# Patient Record
Sex: Female | Born: 1955 | Race: Black or African American | Hispanic: No | Smoking: Never smoker
Health system: Southern US, Community
[De-identification: ages and names within clinical notes are randomized; demographics above are authoritative.]

## PROBLEM LIST (undated history)

## (undated) DIAGNOSIS — K219 Gastro-esophageal reflux disease without esophagitis: Secondary | ICD-10-CM

## (undated) DIAGNOSIS — T7840XA Allergy, unspecified, initial encounter: Secondary | ICD-10-CM

## (undated) DIAGNOSIS — E119 Type 2 diabetes mellitus without complications: Secondary | ICD-10-CM

## (undated) DIAGNOSIS — M810 Age-related osteoporosis without current pathological fracture: Secondary | ICD-10-CM

## (undated) DIAGNOSIS — G43909 Migraine, unspecified, not intractable, without status migrainosus: Secondary | ICD-10-CM

## (undated) DIAGNOSIS — J45909 Unspecified asthma, uncomplicated: Secondary | ICD-10-CM

## (undated) DIAGNOSIS — M1990 Unspecified osteoarthritis, unspecified site: Secondary | ICD-10-CM

## (undated) DIAGNOSIS — F419 Anxiety disorder, unspecified: Secondary | ICD-10-CM

## (undated) DIAGNOSIS — N83209 Unspecified ovarian cyst, unspecified side: Secondary | ICD-10-CM

## (undated) DIAGNOSIS — M75100 Unspecified rotator cuff tear or rupture of unspecified shoulder, not specified as traumatic: Secondary | ICD-10-CM

## (undated) HISTORY — PX: BREAST LUMPECTOMY: SHX2

## (undated) HISTORY — DX: Unspecified ovarian cyst, unspecified side: N83.209

## (undated) HISTORY — PX: BREAST EXCISIONAL BIOPSY: SUR124

## (undated) HISTORY — DX: Allergy, unspecified, initial encounter: T78.40XA

## (undated) HISTORY — DX: Gastro-esophageal reflux disease without esophagitis: K21.9

## (undated) HISTORY — DX: Age-related osteoporosis without current pathological fracture: M81.0

## (undated) HISTORY — PX: COLONOSCOPY: SHX174

## (undated) HISTORY — DX: Type 2 diabetes mellitus without complications: E11.9

## (undated) HISTORY — DX: Unspecified asthma, uncomplicated: J45.909

## (undated) HISTORY — DX: Anxiety disorder, unspecified: F41.9

---

## 2018-04-16 ENCOUNTER — Encounter (HOSPITAL_COMMUNITY): Payer: Self-pay | Admitting: Emergency Medicine

## 2018-04-16 ENCOUNTER — Emergency Department (HOSPITAL_COMMUNITY)
Admission: EM | Admit: 2018-04-16 | Discharge: 2018-04-17 | Disposition: A | Discharge status: Home / Self Care | Payer: BLUE CROSS/BLUE SHIELD | Attending: Emergency Medicine | Admitting: Emergency Medicine

## 2018-04-16 ENCOUNTER — Other Ambulatory Visit: Payer: Self-pay

## 2018-04-16 ENCOUNTER — Emergency Department (HOSPITAL_COMMUNITY): Payer: BLUE CROSS/BLUE SHIELD

## 2018-04-16 DIAGNOSIS — R0789 Other chest pain: Secondary | ICD-10-CM | POA: Diagnosis present

## 2018-04-16 DIAGNOSIS — Z79899 Other long term (current) drug therapy: Secondary | ICD-10-CM | POA: Diagnosis not present

## 2018-04-16 HISTORY — DX: Unspecified osteoarthritis, unspecified site: M19.90

## 2018-04-16 HISTORY — DX: Migraine, unspecified, not intractable, without status migrainosus: G43.909

## 2018-04-16 LAB — CBC
HCT: 39.7 % (ref 36.0–46.0)
Hemoglobin: 12.1 g/dL (ref 12.0–15.0)
MCH: 24.6 pg — ABNORMAL LOW (ref 26.0–34.0)
MCHC: 30.5 g/dL (ref 30.0–36.0)
MCV: 80.7 fL (ref 80.0–100.0)
Platelets: 300 10*3/uL (ref 150–400)
RBC: 4.92 MIL/uL (ref 3.87–5.11)
RDW: 15.4 % (ref 11.5–15.5)
WBC: 6.5 10*3/uL (ref 4.0–10.5)
nRBC: 0 % (ref 0.0–0.2)

## 2018-04-16 LAB — BASIC METABOLIC PANEL
ANION GAP: 7 (ref 5–15)
BUN: 13 mg/dL (ref 8–23)
CO2: 23 mmol/L (ref 22–32)
Calcium: 9.2 mg/dL (ref 8.9–10.3)
Chloride: 108 mmol/L (ref 98–111)
Creatinine, Ser: 0.77 mg/dL (ref 0.44–1.00)
GFR calc non Af Amer: >60 mL/min (ref >60)
GLUCOSE: 126 mg/dL — AB (ref 70–99)
Potassium: 3.5 mmol/L (ref 3.5–5.1)
Sodium: 138 mmol/L (ref 135–145)

## 2018-04-16 LAB — I-STAT TROPONIN, ED: Troponin i, poc: 0 ng/mL (ref 0.00–0.08)

## 2018-04-16 MED ORDER — SODIUM CHLORIDE 0.9% FLUSH: 3.0000 mL | Freq: Once | INTRAVENOUS | Status: DC

## 2018-04-16 NOTE — ED Provider Notes (Signed)
[facility] EMERGENCY DEPARTMENT Provider Note   CSN: [id number] Arrival date & time: 04/16/18  2057    History   Chief Complaint Chief Complaint  Patient presents with   Chest Pain    HPI Amber Nash is a 63 y.o. female.      Chest Pain  Pain location:  Substernal area Pain quality: tightness   Pain radiates to:  Does not radiate Pain severity:  Moderate Onset quality:  Gradual Duration:  1 day Timing:  Constant Progression:  Worsening Chronicity:  New Context: breathing   Relieved by:  None tried Associated symptoms: shortness of breath   Associated symptoms: no abdominal pain, no back pain, no cough, no fever, no palpitations and no vomiting     Past Medical History:  Diagnosis Date   Arthritis    Migraine     There are no active problems to display for this patient.   Past Surgical History:  Procedure Laterality Date   BREAST LUMPECTOMY Left      OB History   No obstetric history on file.      Home Medications    Prior to Admission medications   Medication Sig Start Date End Date Taking? Authorizing Provider  cholecalciferol (VITAMIN D3) 25 MCG (1000 UT) tablet Take 1,000 Units by mouth daily.   Yes [provider]  rizatriptan (MAXALT) 10 MG tablet Take 10 mg by mouth daily as needed for migraine. May repeat in 2 hours if needed   Yes [provider]    Family History No family history on file.  Social History Social History   Tobacco Use   Smoking status: Never Smoker   Smokeless tobacco: Never Used  Substance Use Topics   Alcohol use: Not Currently   Drug use: Not Currently     Allergies   Patient has no known allergies.   Review of Systems Review of Systems  Constitutional: Negative for chills and fever.  HENT: Negative for ear pain and sore throat.   Eyes: Negative for pain and visual disturbance.  Respiratory: Positive for shortness of breath. Negative for cough.     Cardiovascular: Positive for chest pain. Negative for palpitations.  Gastrointestinal: Negative for abdominal pain and vomiting.  Genitourinary: Negative for dysuria and hematuria.  Musculoskeletal: Negative for arthralgias and back pain.  Skin: Negative for color change and rash.  Neurological: Negative for seizures and syncope.  All other systems reviewed and are negative.    Physical Exam Updated Vital Signs BP 105/71    Pulse 75    Temp (!) 97.5 F (36.4 C) (Oral)    Resp 13    Ht 5\' 4"  (1.626 m)    Wt 56.7 kg    SpO2 97%    BMI 21.46 kg/m   Physical Exam Vitals signs and nursing note reviewed.  Constitutional:      General: She is not in acute distress.    Appearance: She is well-developed.  HENT:     Head: Normocephalic and atraumatic.  Eyes:     Conjunctiva/sclera: Conjunctivae normal.  Neck:     Musculoskeletal: Neck supple.  Cardiovascular:     Rate and Rhythm: Normal rate and regular rhythm.     Heart sounds: No murmur.  Pulmonary:     Effort: Pulmonary effort is normal. No respiratory distress.     Breath sounds: Normal breath sounds.  Abdominal:     Palpations: Abdomen is soft.     Tenderness: There is no abdominal tenderness.  Musculoskeletal: Normal range of motion.        General: No swelling.     Right lower leg: No edema.     Left lower leg: No edema.  Skin:    General: Skin is warm and dry.  Neurological:     General: No focal deficit present.     Mental Status: She is alert and oriented to person, place, and time.     Cranial Nerves: No cranial nerve deficit.     Sensory: No sensory deficit.      ED Treatments / Results  Labs (all labs ordered are listed, but only abnormal results are displayed) Labs Reviewed  BASIC METABOLIC PANEL - Abnormal; Notable for the following components:      Result Value   Glucose, Bld 126 (*)    All other components within normal limits  CBC - Abnormal; Notable for the following components:   MCH 24.6 (*)     All other components within normal limits  D-DIMER, QUANTITATIVE (NOT AT [facility])  TROPONIN I  I-STAT TROPONIN, ED    EKG None  Radiology Dg Chest 2 View  Result Date: 04/16/2018 CLINICAL DATA:  Central chest tightness, shortness of breath EXAM: CHEST - 2 VIEW COMPARISON:  None. FINDINGS: Heart and mediastinal contours are within normal limits. No focal opacities or effusions. No acute bony abnormality. IMPRESSION: No active cardiopulmonary disease. Electronically Signed   By: Rolm Baptise M.D.   On: 04/16/2018 21:29    Procedures Procedures (including critical care time)  Medications Ordered in ED Medications  sodium chloride flush (NS) 0.9 % injection 3 mL (has no administration in time range)  albuterol (PROVENTIL HFA;VENTOLIN HFA) 108 (90 Base) MCG/ACT inhaler 2 puff (2 puffs Inhalation Given 04/17/18 0055)     Initial Impression / Assessment and Plan / ED Course  I have reviewed the triage vital signs and the nursing notes.  Pertinent labs & imaging results that were available during my care of the patient were reviewed by me and considered in my medical decision making (see chart for details).       Patient is a 63 year old female who presents the emergency department complaining of substernal chest pain that started earlier this morning.  Patient states that pain has been waxing and waning throughout the day however is been constant now for the past 4 hours.  She describes the pain as a tightness located in the center of her chest.  She states that she recently returned from a trip to [place] 2 days ago where she spent extended amount of time in a car.  She denies any unilateral lower extremity swelling.  On initial evaluation of the patient she was hemodynamically stable and nontoxic-appearing.  Patient was afebrile, not tachycardic, normotensive, satting appropriately on room air.  Physical exam as detailed above which is remarkable for comfortable appearing 63 year old  female.  Lungs are clear to auscultation and no murmurs rubs or gallops are appreciated on cardiovascular examination.  She has equal pulses in all 4 extremities with no unilateral lower extremity swelling present.  Prior to my evaluation of the patient she had EKG, chest x-ray, and basic labs obtained via the triage process.  These were reviewed by myself and used in medical decision-making.  EKG showing normal sinus rhythm with normal intervals.  Normal axis, good R wave progression, and no ST or T wave abnormalities concerning for acute ischemia.  Chest x-ray with no acute cardiac or pulmonary pathology.  CBC and metabolic  panel both unremarkable.  Initial troponin negative.  Patient low risk by HEART score of 3.  Unable to [facility] patient as she is 63 years old.  She is low risk by Wells criteria so d-dimer was obtained.  Doubt aortic dissection, pneumothorax, pneumomediastinum, or esophageal rupture as cause of the patient's current symptoms as this would be inconsistent with history, physical exam findings, and imaging obtained.  While awaiting for the patient's d-dimer and delta troponin she was given 2 puffs of albuterol.  She states she has a history of cough variant asthma that was diagnosed 1 year ago and she had similar tightness in her chest at that time.  She does not have impressive wheezing on examination that would make me think this is acute asthma exacerbation however 2 puffs of albuterol were given in attempt to improve patient's chest tightness.  While awaiting D-dimer result and Delta troponin care was transferred to [facility]. If both negative patient is safe for discharge home as symptoms are likely MSK related. Please see their note for ultimate ED disposition.   Final Clinical Impressions(s) / ED Diagnoses   Final diagnoses:  Chest wall pain    ED Discharge Orders    None       Tommie Raymond, MD 04/17/18 1478    Noemi Chapel, MD 04/17/18 2326

## 2018-04-16 NOTE — ED Triage Notes (Signed)
Pt reports chest tightness onset this AM with dizziness/lightheadedness, SOB. 5/10. Radiates to back.

## 2018-04-17 LAB — D-DIMER, QUANTITATIVE: D-Dimer, Quant: <0.27 ug/mL-FEU (ref 0.00–0.50)

## 2018-04-17 LAB — TROPONIN I: Troponin I: <0.03 ng/mL (ref <0.03)

## 2018-04-17 MED ORDER — ALBUTEROL SULFATE HFA 108 (90 BASE) MCG/ACT IN AERS
2.0000 | INHALATION_SPRAY | Freq: Once | RESPIRATORY_TRACT | Status: AC
  Administered 2018-04-17: 2 via RESPIRATORY_TRACT
  Filled 2018-04-17: qty 6.7

## 2018-04-17 NOTE — ED Provider Notes (Signed)
2:43 AM Patient care assumed from Dr. Damita Dunnings at change of shift pending delta troponin and d-dimer.  These have been reviewed and are both negative today.  The patient has had some improvement in her chest tightness with an albuterol inhaler.  I have advised that she continue use of this every 4-6 hours as needed and follow-up with her primary care doctor.  Symptoms also suspected to have a MSK component, appropriate for management with OTC tylenol or ibuprofen.  Return precautions discussed and provided. Patient discharged in stable condition with no unaddressed concerns.  Results for orders placed or performed during the hospital encounter of 16/10/96  Basic metabolic panel  Result Value Ref Range   Sodium 138 135 - 145 mmol/L   Potassium 3.5 3.5 - 5.1 mmol/L   Chloride 108 98 - 111 mmol/L   CO2 23 22 - 32 mmol/L   Glucose, Bld 126 (H) 70 - 99 mg/dL   BUN 13 8 - 23 mg/dL   Creatinine, Ser 0.77 0.44 - 1.00 mg/dL   Calcium 9.2 8.9 - 10.3 mg/dL   GFR calc non Af Amer >60 >60 mL/min   GFR calc Af Amer >60 >60 mL/min   Anion gap 7 5 - 15  CBC  Result Value Ref Range   WBC 6.5 4.0 - 10.5 K/uL   RBC 4.92 3.87 - 5.11 MIL/uL   Hemoglobin 12.1 12.0 - 15.0 g/dL   HCT 39.7 36.0 - 46.0 %   MCV 80.7 80.0 - 100.0 fL   MCH 24.6 (L) 26.0 - 34.0 pg   MCHC 30.5 30.0 - 36.0 g/dL   RDW 15.4 11.5 - 15.5 %   Platelets 300 150 - 400 K/uL   nRBC 0.0 0.0 - 0.2 %  D-dimer, quantitative (not at [facility])  Result Value Ref Range   D-Dimer, Quant <0.27 0.00 - 0.50 ug/mL-FEU  Troponin I - ONCE - STAT  Result Value Ref Range   Troponin I <0.03 <0.03 ng/mL  I-stat troponin, ED  Result Value Ref Range   Troponin i, poc 0.00 0.00 - 0.08 ng/mL   Comment 3           Dg Chest 2 View  Result Date: 04/16/2018 CLINICAL DATA:  Central chest tightness, shortness of breath EXAM: CHEST - 2 VIEW COMPARISON:  None. FINDINGS: Heart and mediastinal contours are within normal limits. No focal opacities or effusions. No acute  bony abnormality. IMPRESSION: No active cardiopulmonary disease. Electronically Signed   By: Rolm Baptise M.D.   On: 04/16/2018 21:29      Antonietta Breach, PA-C 04/17/18 [place], April, MD 04/17/18 [phone]

## 2018-04-17 NOTE — Discharge Instructions (Addendum)
You may continue use of an albuterol inhaler.  Use 2 puffs every 4-6 hours as needed for cough, wheezing, shortness of breath.  We recommend follow-up with a primary care doctor regarding your symptoms today.

## 2018-11-20 ENCOUNTER — Other Ambulatory Visit: Payer: Self-pay | Admitting: Internal Medicine

## 2018-11-20 DIAGNOSIS — E2839 Other primary ovarian failure: Secondary | ICD-10-CM

## 2018-11-20 DIAGNOSIS — Z1231 Encounter for screening mammogram for malignant neoplasm of breast: Secondary | ICD-10-CM

## 2018-11-26 ENCOUNTER — Ambulatory Visit
Admission: RE | Admit: 2018-11-26 | Discharge: 2018-11-26 | Disposition: A | Discharge status: Home / Self Care | Payer: PRIVATE HEALTH INSURANCE | Source: Ambulatory Visit | Attending: Internal Medicine | Admitting: Internal Medicine

## 2018-11-26 ENCOUNTER — Other Ambulatory Visit: Payer: Self-pay | Admitting: Internal Medicine

## 2018-11-26 DIAGNOSIS — R140 Abdominal distension (gaseous): Secondary | ICD-10-CM

## 2018-11-26 DIAGNOSIS — Z8041 Family history of malignant neoplasm of ovary: Secondary | ICD-10-CM

## 2019-01-11 ENCOUNTER — Other Ambulatory Visit: Payer: Self-pay

## 2019-01-11 ENCOUNTER — Ambulatory Visit
Admission: RE | Admit: 2019-01-11 | Discharge: 2019-01-11 | Disposition: A | Discharge status: Home / Self Care | Payer: Medicare Other | Source: Ambulatory Visit | Attending: Internal Medicine | Admitting: Internal Medicine

## 2019-01-11 DIAGNOSIS — Z1231 Encounter for screening mammogram for malignant neoplasm of breast: Secondary | ICD-10-CM

## 2019-02-11 ENCOUNTER — Encounter: Payer: Self-pay | Admitting: Gastroenterology

## 2019-02-12 ENCOUNTER — Other Ambulatory Visit: Payer: Self-pay | Admitting: Internal Medicine

## 2019-02-13 ENCOUNTER — Other Ambulatory Visit: Payer: Self-pay | Admitting: Internal Medicine

## 2019-02-19 ENCOUNTER — Other Ambulatory Visit: Payer: Self-pay

## 2019-02-19 ENCOUNTER — Ambulatory Visit
Admission: RE | Admit: 2019-02-19 | Discharge: 2019-02-19 | Disposition: A | Discharge status: Home / Self Care | Payer: Medicare Other | Source: Ambulatory Visit | Attending: Internal Medicine | Admitting: Internal Medicine

## 2019-02-19 DIAGNOSIS — E2839 Other primary ovarian failure: Secondary | ICD-10-CM

## 2019-02-23 ENCOUNTER — Other Ambulatory Visit: Payer: Self-pay | Admitting: Internal Medicine

## 2019-02-23 DIAGNOSIS — N83201 Unspecified ovarian cyst, right side: Secondary | ICD-10-CM

## 2019-02-25 ENCOUNTER — Ambulatory Visit (AMBULATORY_SURGERY_CENTER): Payer: Self-pay | Admitting: *Deleted

## 2019-02-25 ENCOUNTER — Other Ambulatory Visit: Payer: Self-pay

## 2019-02-25 VITALS — Temp 96.0°F | Ht 64.0 in | Wt 132.0 lb

## 2019-02-25 DIAGNOSIS — Z01818 Encounter for other preprocedural examination: Secondary | ICD-10-CM

## 2019-02-25 DIAGNOSIS — Z1211 Encounter for screening for malignant neoplasm of colon: Secondary | ICD-10-CM

## 2019-02-25 NOTE — Progress Notes (Signed)
No egg or soy allergy known to patient  No issues with past sedation with any surgeries  or procedures, no intubation problems  No diet pills per patient No home 02 use per patient  No blood thinners per patient  Pt denies issues with constipation - uses dulcolax as needed- giver her loose stools -- feels bloated  No A fib or A flutter  EMMI video sent to pt's e mail   Due to the COVID-19 pandemic we are asking patients to follow these guidelines. Please only bring one care partner. Please be aware that your care partner may wait in the car in the parking lot or if they feel like they will be too hot to wait in the car, they may wait in the lobby on the 4th floor. All care partners are required to wear a mask the entire time (we do not have any that we can provide them), they need to practice social distancing, and we will do a Covid check for all patient's and care partners when you arrive. Also we will check their temperature and your temperature. If the care partner waits in their car they need to stay in the parking lot the entire time and we will call them on their cell phone when the patient is ready for discharge so they can bring the car to the front of the building. Also all patient's will need to wear a mask into building.

## 2019-03-07 ENCOUNTER — Other Ambulatory Visit: Payer: Self-pay | Admitting: Gastroenterology

## 2019-03-07 ENCOUNTER — Ambulatory Visit (INDEPENDENT_AMBULATORY_CARE_PROVIDER_SITE_OTHER): Payer: Medicare Other

## 2019-03-07 DIAGNOSIS — Z1159 Encounter for screening for other viral diseases: Secondary | ICD-10-CM

## 2019-03-08 LAB — SARS CORONAVIRUS 2 (TAT 6-24 HRS): SARS Coronavirus 2: NEGATIVE

## 2019-03-12 ENCOUNTER — Ambulatory Visit (AMBULATORY_SURGERY_CENTER): Payer: Medicare Other | Admitting: Gastroenterology

## 2019-03-12 ENCOUNTER — Encounter: Payer: Self-pay | Admitting: Gastroenterology

## 2019-03-12 ENCOUNTER — Other Ambulatory Visit: Payer: Self-pay

## 2019-03-12 VITALS — BP 87/47 | HR 87 | Temp 96.6°F | Resp 14 | Ht 64.0 in | Wt 132.0 lb

## 2019-03-12 DIAGNOSIS — K621 Rectal polyp: Secondary | ICD-10-CM

## 2019-03-12 DIAGNOSIS — D128 Benign neoplasm of rectum: Secondary | ICD-10-CM

## 2019-03-12 DIAGNOSIS — Z1211 Encounter for screening for malignant neoplasm of colon: Secondary | ICD-10-CM | POA: Diagnosis present

## 2019-03-12 MED ORDER — SODIUM CHLORIDE 0.9 % IV SOLN: 500.0000 mL | Freq: Once | INTRAVENOUS | Status: DC

## 2019-03-12 NOTE — Progress Notes (Signed)
Called to room to assist during endoscopic procedure.  Patient ID and intended procedure confirmed with present staff. Received instructions for my participation in the procedure from the performing physician.  

## 2019-03-12 NOTE — Progress Notes (Signed)
Patient received 1st COVID vaccine on Feb 4.  2nd vaginal imaging in early Feb showed nothing and patient will not have the scheduled surgery.  JB - temp DT - vitals

## 2019-03-12 NOTE — Op Note (Signed)
[facility] Patient Name: Amber Nash Procedure Date: 03/12/2019 10:48 AM MRN: [id number] Endoscopist: Justice Britain , MD Age: 64 Referring MD:  Date of Birth: [birth year] Gender: Female Account #: [id number] Procedure:                Colonoscopy Indications:              Screening for colorectal malignant neoplasm Medicines:                Monitored Anesthesia Care Procedure:                Pre-Anesthesia Assessment:                           - Prior to the procedure, a History and Physical                            was performed, and patient medications and                            allergies were reviewed. The patient's tolerance of                            previous anesthesia was also reviewed. The risks                            and benefits of the procedure and the sedation                            options and risks were discussed with the patient.                            All questions were answered, and informed consent                            was obtained. Prior Anticoagulants: The patient has                            taken no previous anticoagulant or antiplatelet                            agents except for aspirin. ASA Grade Assessment: II                            - A patient with mild systemic disease. After                            reviewing the risks and benefits, the patient was                            deemed in satisfactory condition to undergo the                            procedure.  After obtaining informed consent, the colonoscope                            was passed under direct vision. Throughout the                            procedure, the patient's blood pressure, pulse, and                            oxygen saturations were monitored continuously. The                            Colonoscope was introduced through the anus and                            advanced to the 5 cm into the ileum. The                        colonoscopy was performed without difficulty. The                            patient tolerated the procedure. The quality of the                            bowel preparation was good. The terminal ileum,                            ileocecal valve, appendiceal orifice, and rectum                            were photographed. Scope In: 11:01:52 AM Scope Out: 11:17:18 AM Scope Withdrawal Time: 0 hours 10 minutes 32 seconds  Total Procedure Duration: 0 hours 15 minutes 26 seconds  Findings:                 The digital rectal exam was abnormal. Pertinent                            negatives include no palpable rectal lesions.                           The terminal ileum and ileocecal valve appeared                            normal.                           A few small-mouthed diverticula were found in the                            recto-sigmoid colon.                           A 2 mm polyp was found in the rectum. The polyp was  sessile. The polyp was removed with a cold snare.                            Resection and retrieval were complete.                           Normal mucosa was found in the entire colon                            otherwise.                           Non-bleeding non-thrombosed internal hemorrhoids                            were found during retroflexion, during perianal                            exam and during digital exam. The hemorrhoids were                            Grade I (internal hemorrhoids that do not prolapse). Complications:            No immediate complications. Estimated Blood Loss:     Estimated blood loss was minimal. Impression:               - Abnormal digital rectal exam.                           - The examined portion of the ileum was normal.                           - Diverticulosis in the recto-sigmoid colon.                           - One 2 mm polyp in the rectum, removed with a cold                             snare. Resected and retrieved.                           - Normal mucosa in the entire examined colon                            otherwise.                           - Non-bleeding non-thrombosed internal hemorrhoids. Recommendation:           - The patient will be observed post-procedure,                            until all discharge criteria are met.                           - Discharge patient to home.                           -  Patient has a contact number available for                            emergencies. The signs and symptoms of potential                            delayed complications were discussed with the                            patient. Return to normal activities tomorrow.                            Written discharge instructions were provided to the                            patient.                           - High fiber diet.                           - Use FiberCon 1 tablet PO daily.                           - Continue present medications.                           - Await pathology results.                           - Repeat colonoscopy in 7/10 years for surveillance                            based on pathology results and findings of                            adenomatous tissue.                           - The findings and recommendations were discussed                            with the patient. Justice Britain, MD 03/12/2019 11:21:42 AM

## 2019-03-12 NOTE — Patient Instructions (Signed)
HANDOUTS PROVIDED ON: HIGH FIBER DIET, POLYPS, DIVERTICULOSIS, & HEMORRHOIDS  The polyp removed today have been sent for pathology.  The results can take 1-3 weeks to receive.  When your next colonoscopy should occur will be based on the pathology results.    You may resume your previous diet and medication schedule.  Begin using OTC Fibercon 1 tablet daily.  Thank you for allowing [place] to care for you today!!!  YOU HAD AN ENDOSCOPIC PROCEDURE TODAY AT [facility]:   Refer to the procedure report that was given to you for any specific questions about what was found during the examination.  If the procedure report does not answer your questions, please call your gastroenterologist to clarify.  If you requested that your care partner not be given the details of your procedure findings, then the procedure report has been included in a sealed envelope for you to review at your convenience later.  YOU SHOULD EXPECT: Some feelings of bloating in the abdomen. Passage of more gas than usual.  Walking can help get rid of the air that was put into your GI tract during the procedure and reduce the bloating. If you had a lower endoscopy (such as a colonoscopy or flexible sigmoidoscopy) you may notice spotting of blood in your stool or on the toilet paper. If you underwent a bowel prep for your procedure, you may not have a normal bowel movement for a few days.  Please Note:  You might notice some irritation and congestion in your nose or some drainage.  This is from the oxygen used during your procedure.  There is no need for concern and it should clear up in a day or so.  SYMPTOMS TO REPORT IMMEDIATELY:   Following lower endoscopy (colonoscopy or flexible sigmoidoscopy):  Excessive amounts of blood in the stool  Significant tenderness or worsening of abdominal pains  Swelling of the abdomen that is new, acute  Fever of 100F or higher  For urgent or emergent issues, a gastroenterologist  can be reached at any hour by calling [phone].   DIET:  We do recommend a small meal at first, but then you may proceed to your regular diet.  Drink plenty of fluids but you should avoid alcoholic beverages for 24 hours.  ACTIVITY:  You should plan to take it easy for the rest of today and you should NOT DRIVE or use heavy machinery until tomorrow (because of the sedation medicines used during the test).    FOLLOW UP: Our staff will call the number listed on your records 48-72 hours following your procedure to check on you and address any questions or concerns that you may have regarding the information given to you following your procedure. If we do not reach you, we will leave a message.  We will attempt to reach you two times.  During this call, we will ask if you have developed any symptoms of COVID 19. If you develop any symptoms (ie: fever, flu-like symptoms, shortness of breath, cough etc.) before then, please call [phone].  If you test positive for Covid 19 in the 2 weeks post procedure, please call and report this information to [place].    If any biopsies were taken you will be contacted by phone or by letter within the next 1-3 weeks.  Please call us at [phone] if you have not heard about the biopsies in 3 weeks.    SIGNATURES/CONFIDENTIALITY: You and/or your care partner have signed paperwork which  will be entered into your electronic medical record.  These signatures attest to the fact that that the information above on your After Visit Summary has been reviewed and is understood.  Full responsibility of the confidentiality of this discharge information lies with you and/or your care-partner.

## 2019-03-12 NOTE — Progress Notes (Signed)
Pt tolerated well. VSS. Awake and to recovery. 

## 2019-03-13 ENCOUNTER — Telehealth: Payer: Self-pay

## 2019-03-13 NOTE — Telephone Encounter (Signed)
Amber Nash can you help with this?

## 2019-03-13 NOTE — Telephone Encounter (Signed)
Returned patient's call about her glasses missing after her procedure yesterday.  Glasses were present before going over her discharge instructions and placed with her other belongings at bedside.  She has been unable to find them.  I assured her if they turn up I will call her to let her know.

## 2019-03-14 ENCOUNTER — Telehealth: Payer: Self-pay

## 2019-03-14 NOTE — Telephone Encounter (Signed)
  Follow up Call-  Call back number 03/12/2019  Post procedure Call Back phone  # [phone]  Permission to leave phone message Yes     Patient questions:  Do you have a fever, pain , or abdominal swelling? No. Pain Score  0 *  Have you tolerated food without any problems? Yes.    Have you been able to return to your normal activities? Yes.    Do you have any questions about your discharge instructions: Diet   No. Medications  No. Follow up visit  No.  Do you have questions or concerns about your Care? No.  Actions: * If pain score is 4 or above: No action needed, pain <4. 1. Have you developed a fever since your procedure? no  2.   Have you had an respiratory symptoms (SOB or cough) since your procedure? no  3.   Have you tested positive for COVID 19 since your procedure no  4.   Have you had any family members/close contacts diagnosed with the COVID 19 since your procedure?  no   If yes to any of these questions please route to Joylene John, RN and Alphonsa Gin, Therapist, sports.

## 2019-03-16 ENCOUNTER — Encounter: Payer: Self-pay | Admitting: Gastroenterology

## 2019-05-09 ENCOUNTER — Other Ambulatory Visit: Payer: Self-pay | Admitting: General Surgery

## 2019-05-09 DIAGNOSIS — N6452 Nipple discharge: Secondary | ICD-10-CM

## 2019-05-27 ENCOUNTER — Other Ambulatory Visit: Payer: Self-pay

## 2019-05-27 ENCOUNTER — Ambulatory Visit
Admission: RE | Admit: 2019-05-27 | Discharge: 2019-05-27 | Disposition: A | Discharge status: Home / Self Care | Payer: Medicare Other | Source: Ambulatory Visit | Attending: General Surgery | Admitting: General Surgery

## 2019-05-27 DIAGNOSIS — N6452 Nipple discharge: Secondary | ICD-10-CM

## 2019-05-27 MED ORDER — GADOBUTROL 1 MMOL/ML IV SOLN
6.0000 mL | Freq: Once | INTRAVENOUS | Status: AC | PRN
  Administered 2019-05-27: 6 mL via INTRAVENOUS

## 2019-06-10 ENCOUNTER — Other Ambulatory Visit: Payer: Self-pay | Admitting: General Surgery

## 2019-06-10 DIAGNOSIS — N6320 Unspecified lump in the left breast, unspecified quadrant: Secondary | ICD-10-CM

## 2019-06-10 DIAGNOSIS — K7689 Other specified diseases of liver: Secondary | ICD-10-CM

## 2019-06-10 DIAGNOSIS — N6310 Unspecified lump in the right breast, unspecified quadrant: Secondary | ICD-10-CM

## 2019-06-11 ENCOUNTER — Other Ambulatory Visit: Payer: Self-pay | Admitting: General Surgery

## 2019-06-11 ENCOUNTER — Ambulatory Visit
Admission: RE | Admit: 2019-06-11 | Discharge: 2019-06-11 | Disposition: A | Discharge status: Home / Self Care | Payer: Medicare Other | Source: Ambulatory Visit | Attending: General Surgery | Admitting: General Surgery

## 2019-06-11 ENCOUNTER — Other Ambulatory Visit: Payer: Self-pay

## 2019-06-11 DIAGNOSIS — N6310 Unspecified lump in the right breast, unspecified quadrant: Secondary | ICD-10-CM

## 2019-06-11 DIAGNOSIS — N6320 Unspecified lump in the left breast, unspecified quadrant: Secondary | ICD-10-CM

## 2019-06-12 ENCOUNTER — Other Ambulatory Visit: Payer: Self-pay | Admitting: General Surgery

## 2019-06-12 DIAGNOSIS — R9389 Abnormal findings on diagnostic imaging of other specified body structures: Secondary | ICD-10-CM

## 2019-06-13 ENCOUNTER — Ambulatory Visit
Admission: RE | Admit: 2019-06-13 | Discharge: 2019-06-13 | Disposition: A | Discharge status: Home / Self Care | Payer: Medicare Other | Source: Ambulatory Visit | Attending: General Surgery | Admitting: General Surgery

## 2019-06-13 DIAGNOSIS — K7689 Other specified diseases of liver: Secondary | ICD-10-CM

## 2019-06-25 ENCOUNTER — Ambulatory Visit
Admission: RE | Admit: 2019-06-25 | Discharge: 2019-06-25 | Disposition: A | Discharge status: Home / Self Care | Payer: Medicare Other | Source: Ambulatory Visit | Attending: General Surgery | Admitting: General Surgery

## 2019-06-25 ENCOUNTER — Other Ambulatory Visit: Payer: Self-pay

## 2019-06-25 DIAGNOSIS — R9389 Abnormal findings on diagnostic imaging of other specified body structures: Secondary | ICD-10-CM

## 2019-06-25 MED ORDER — GADOBUTROL 1 MMOL/ML IV SOLN
5.0000 mL | Freq: Once | INTRAVENOUS | Status: AC | PRN
  Administered 2019-06-25: 5 mL via INTRAVENOUS

## 2019-07-08 ENCOUNTER — Other Ambulatory Visit: Payer: Self-pay | Admitting: General Practice

## 2019-07-08 DIAGNOSIS — R140 Abdominal distension (gaseous): Secondary | ICD-10-CM

## 2019-07-30 ENCOUNTER — Other Ambulatory Visit: Payer: Self-pay

## 2019-07-30 ENCOUNTER — Ambulatory Visit
Admission: RE | Admit: 2019-07-30 | Discharge: 2019-07-30 | Disposition: A | Discharge status: Home / Self Care | Payer: Medicare Other | Source: Ambulatory Visit | Attending: General Practice | Admitting: General Practice

## 2019-07-30 DIAGNOSIS — R140 Abdominal distension (gaseous): Secondary | ICD-10-CM

## 2019-07-30 MED ORDER — IOPAMIDOL (ISOVUE-300) INJECTION 61%
100.0000 mL | Freq: Once | INTRAVENOUS | Status: AC | PRN
  Administered 2019-07-30: 100 mL via INTRAVENOUS

## 2019-08-19 ENCOUNTER — Other Ambulatory Visit: Payer: Medicare Other

## 2019-12-24 DIAGNOSIS — Z1231 Encounter for screening mammogram for malignant neoplasm of breast: Secondary | ICD-10-CM

## 2020-01-08 ENCOUNTER — Other Ambulatory Visit: Payer: Self-pay | Admitting: Internal Medicine

## 2020-01-08 DIAGNOSIS — Z1231 Encounter for screening mammogram for malignant neoplasm of breast: Secondary | ICD-10-CM

## 2020-01-16 ENCOUNTER — Other Ambulatory Visit: Payer: Self-pay

## 2020-01-16 ENCOUNTER — Other Ambulatory Visit: Payer: Self-pay | Admitting: Rheumatology

## 2020-01-16 ENCOUNTER — Ambulatory Visit
Admission: RE | Admit: 2020-01-16 | Discharge: 2020-01-16 | Disposition: A | Discharge status: Home / Self Care | Payer: Medicare Other | Source: Ambulatory Visit | Attending: Internal Medicine | Admitting: Internal Medicine

## 2020-01-16 ENCOUNTER — Other Ambulatory Visit: Payer: Self-pay | Admitting: General Practice

## 2020-01-16 DIAGNOSIS — D241 Benign neoplasm of right breast: Secondary | ICD-10-CM

## 2020-01-16 DIAGNOSIS — Z1231 Encounter for screening mammogram for malignant neoplasm of breast: Secondary | ICD-10-CM

## 2020-01-16 DIAGNOSIS — D242 Benign neoplasm of left breast: Secondary | ICD-10-CM

## 2020-01-20 ENCOUNTER — Other Ambulatory Visit: Payer: Self-pay | Admitting: General Practice

## 2020-02-11 ENCOUNTER — Other Ambulatory Visit: Payer: Medicare Other

## 2020-06-05 ENCOUNTER — Other Ambulatory Visit: Payer: Self-pay

## 2020-06-05 ENCOUNTER — Emergency Department (HOSPITAL_COMMUNITY)
Admission: EM | Admit: 2020-06-05 | Discharge: 2020-06-05 | Disposition: A | Discharge status: Home / Self Care | Payer: Medicare Other | Attending: Emergency Medicine | Admitting: Emergency Medicine

## 2020-06-05 ENCOUNTER — Encounter (HOSPITAL_COMMUNITY): Payer: Self-pay | Admitting: Emergency Medicine

## 2020-06-05 DIAGNOSIS — U071 COVID-19: Secondary | ICD-10-CM | POA: Diagnosis not present

## 2020-06-05 DIAGNOSIS — R21 Rash and other nonspecific skin eruption: Secondary | ICD-10-CM | POA: Diagnosis present

## 2020-06-05 DIAGNOSIS — J45909 Unspecified asthma, uncomplicated: Secondary | ICD-10-CM | POA: Insufficient documentation

## 2020-06-05 LAB — I-STAT CHEM 8, ED
BUN: 12 mg/dL (ref 8–23)
Calcium, Ion: 1.24 mmol/L (ref 1.15–1.40)
Chloride: 102 mmol/L (ref 98–111)
Creatinine, Ser: 0.6 mg/dL (ref 0.44–1.00)
Glucose, Bld: 88 mg/dL (ref 70–99)
HCT: 46 % (ref 36.0–46.0)
Hemoglobin: 15.6 g/dL — ABNORMAL HIGH (ref 12.0–15.0)
Potassium: 3.7 mmol/L (ref 3.5–5.1)
Sodium: 140 mmol/L (ref 135–145)
TCO2: 27 mmol/L (ref 22–32)

## 2020-06-05 MED ORDER — NIRMATRELVIR/RITONAVIR (PAXLOVID)TABLET: 2.0000 | ORAL_TABLET | Freq: Two times a day (BID) | ORAL | 0 refills | Status: AC

## 2020-06-05 NOTE — Discharge Instructions (Addendum)
Prescription for Paxlovid given today. This is not stocked at all pharmacies. I recommend calling before going to take your prescription in.

## 2020-06-05 NOTE — ED Provider Notes (Signed)
[facility] DEPT Provider Note   CSN: [id number] Arrival date & time: 06/05/20  1040     History Chief Complaint  Patient presents with  . Eye Pain  . Rash  . Covid Positive    Amber Nash is a 65 y.o. female.  65 year old female with history of asthma presents with positive home COVID test with concern for rash and eye tingling.  Patient states that she noticed that she was coughing more with mucus production starting on May 2 but did not think anything of this.  Patient noticed a rash to her arms and legs described as red dots on May 4 and took a home COVID test which was positive.  Patient states that she noticed her eyes were red and tingling today with a rash to her back which prompted her to call her nurse who advised her to come to the ER for evaluation.  Patient applied over-the-counter allergy eyedrops to her eyes with resolution of redness, reports persistent tingling.  Denies shortness of breath, vomiting, diarrhea.  Patient is fully vaccinated including 2 boosters for COVID.        Past Medical History:  Diagnosis Date  . Allergy   . Anxiety   . Arthritis   . Asthma   . GERD (gastroesophageal reflux disease)   . Migraine   . Osteoporosis   . Ovarian cyst     There are no problems to display for this patient.   Past Surgical History:  Procedure Laterality Date  . BREAST EXCISIONAL BIOPSY Left   . BREAST LUMPECTOMY Left    7 yrs ago - benign   . COLONOSCOPY     in [place]-  ~ 10 yrs ago- she said polyps- but no report      OB History   No obstetric history on file.     Family History  Problem Relation Age of Onset  . Breast cancer Sister 50  . Colon cancer Neg Hx   . Colon polyps Neg Hx   . Esophageal cancer Neg Hx   . Prostate cancer Neg Hx   . Rectal cancer Neg Hx     Social History   Tobacco Use  . Smoking status: Never Smoker  . Smokeless tobacco: Never Used  Vaping Use  . Vaping Use: Never used   Substance Use Topics  . Alcohol use: Not Currently    Comment: occasionally   . Drug use: Not Currently    Home Medications Prior to Admission medications   Medication Sig Start Date End Date Taking? Authorizing Provider  nirmatrelvir/ritonavir EUA (PAXLOVID) TABS Take 2 tablets by mouth 2 (two) times daily for 5 days. Patient GFR is >60. Take nirmatrelvir (150 mg) two tablet(s) twice daily for 5 days and ritonavir (100 mg) one tablet twice daily for 5 days. 06/05/20 06/10/20 Yes Tacy Learn, PA-C  albuterol (VENTOLIN HFA) 108 (90 Base) MCG/ACT inhaler Inhale into the lungs every 6 (six) hours as needed for wheezing or shortness of breath.    [provider]  bisacodyl (DULCOLAX) 5 MG EC tablet Take 5 mg by mouth daily as needed for moderate constipation.    [provider]  cholecalciferol (VITAMIN D3) 25 MCG (1000 UT) tablet Take 1,000 Units by mouth daily.    [provider]  fexofenadine-pseudoephedrine (ALLEGRA-D) 60-120 MG 12 hr tablet Take 1 tablet by mouth 2 (two) times daily.    [provider]  rizatriptan (MAXALT) 10 MG tablet Take 10 mg  by mouth daily as needed for migraine. May repeat in 2 hours if needed    [provider]    Allergies    Patient has no known allergies.  Review of Systems   Review of Systems  Constitutional: Negative for chills and fever.  Eyes: Positive for redness. Negative for visual disturbance.  Respiratory: Negative for shortness of breath.   Cardiovascular: Negative for chest pain.  Gastrointestinal: Negative for abdominal pain, diarrhea, nausea and vomiting.  Musculoskeletal: Negative for arthralgias and myalgias.  Skin: Positive for rash. Negative for wound.  Allergic/Immunologic: Negative for immunocompromised state.  Neurological: Negative for weakness.  Hematological: Negative for adenopathy. Does not bruise/bleed easily.  All other systems reviewed and are negative.   Physical Exam Updated  Vital Signs BP 121/80 (BP Location: Left Arm)   Pulse 86   Temp 98.3 F (36.8 C) (Oral)   Resp 16   SpO2 98%   Physical Exam Vitals and nursing note reviewed.  Constitutional:      General: She is not in acute distress.    Appearance: She is well-developed. She is not diaphoretic.  HENT:     Head: Normocephalic and atraumatic.  Eyes:     Extraocular Movements: Extraocular movements intact.     Conjunctiva/sclera: Conjunctivae normal.     Pupils: Pupils are equal, round, and reactive to light.  Cardiovascular:     Rate and Rhythm: Normal rate and regular rhythm.     Pulses: Normal pulses.     Heart sounds: Normal heart sounds.  Pulmonary:     Effort: Pulmonary effort is normal.     Breath sounds: Normal breath sounds.  Skin:    General: Skin is warm and dry.     Findings: No erythema or rash.  Neurological:     Mental Status: She is alert and oriented to person, place, and time.  Psychiatric:        Behavior: Behavior normal.     ED Results / Procedures / Treatments   Labs (all labs ordered are listed, but only abnormal results are displayed) Labs Reviewed  I-STAT CHEM 8, ED - Abnormal; Notable for the following components:      Result Value   Hemoglobin 15.6 (*)    All other components within normal limits    EKG None  Radiology No results found.  Procedures Procedures   Medications Ordered in ED Medications - No data to display  ED Course  I have reviewed the triage vital signs and the nursing notes.  Pertinent labs & imaging results that were available during my care of the patient were reviewed by me and considered in my medical decision making (see chart for details).  Clinical Course as of 06/05/20 1344  Fri Jun 06, 8691  [id number] 65 year old female with concern for COVID as above.  On exam, conjunctiva are normal, there is no rash noted, no petechia.  Discussed with Dr. Tomi Bamberger, ER attending, plan is to offer Paxlovid, patient to follow up with PCP.   [LM]    Clinical Course User Index [LM] Roque Lias   MDM Rules/Calculators/A&P                          Final Clinical Impression(s) / ED Diagnoses Final diagnoses:  [id number]    Rx / DC Orders ED Discharge Orders         Ordered    nirmatrelvir/ritonavir EUA (PAXLOVID) TABS  2 times daily  06/05/20 1341           Tacy Learn, PA-C 06/05/20 1344    Dorie Rank, MD 06/06/20 [phone]

## 2020-06-05 NOTE — ED Triage Notes (Signed)
Patient is Covid positive as of Wednesday. She presents today with an itchy rash. She also reports her eyes fell like pins and needles. Her primary care told her to come into the ED.

## 2020-12-06 ENCOUNTER — Other Ambulatory Visit: Payer: Self-pay

## 2020-12-06 ENCOUNTER — Emergency Department (HOSPITAL_COMMUNITY)
Admission: EM | Admit: 2020-12-06 | Discharge: 2020-12-06 | Disposition: A | Discharge status: Home / Self Care | Payer: Medicare Other | Attending: Emergency Medicine | Admitting: Emergency Medicine

## 2020-12-06 ENCOUNTER — Emergency Department (HOSPITAL_COMMUNITY): Payer: Medicare Other

## 2020-12-06 DIAGNOSIS — J069 Acute upper respiratory infection, unspecified: Secondary | ICD-10-CM | POA: Diagnosis not present

## 2020-12-06 DIAGNOSIS — J45909 Unspecified asthma, uncomplicated: Secondary | ICD-10-CM | POA: Insufficient documentation

## 2020-12-06 DIAGNOSIS — Z20822 Contact with and (suspected) exposure to covid-19: Secondary | ICD-10-CM | POA: Insufficient documentation

## 2020-12-06 DIAGNOSIS — R059 Cough, unspecified: Secondary | ICD-10-CM | POA: Diagnosis present

## 2020-12-06 LAB — RESP PANEL BY RT-PCR (FLU A&B, COVID) ARPGX2
Influenza A by PCR: NEGATIVE
Influenza B by PCR: NEGATIVE
SARS Coronavirus 2 by RT PCR: NEGATIVE

## 2020-12-06 MED ORDER — PROMETHAZINE-DM 6.25-15 MG/5ML PO SYRP: 5.0000 mL | ORAL_SOLUTION | Freq: Four times a day (QID) | ORAL | 0 refills | Status: AC | PRN

## 2020-12-06 NOTE — Discharge Instructions (Addendum)
Your symptoms may take as much as another week or 2 for your symptoms to resolve.  Your COVID and influenza test were negative.  Your chest x-ray was unremarkable there is no evidence of pneumonia.  Please follow-up with your primary care provider.  Please continue take your medications as prescribed. Your chest x-ray was unremarkable.  There is no evidence of pneumonia.  I would expect your symptoms to start improving over the next 5 or 6 days but may take as long as 1 to 2 weeks   Viral Illness TREATMENT  Treatment is directed at relieving symptoms. There is no cure. Antibiotics are not effective, because the infection is caused by a virus, not by bacteria. Treatment may include:  Increased fluid intake. Sports drinks offer valuable electrolytes, sugars, and fluids.  Breathing heated mist or steam (vaporizer or shower).  Eating chicken soup or other clear broths, and maintaining good nutrition.  Getting plenty of rest.  Using gargles or lozenges for comfort.  Increasing usage of your inhaler if you have asthma.  Return to work when your temperature has returned to normal.  Gargle warm salt water and spit it out for sore throat. Take benadryl to decrease sinus secretions. Continue to alternate between Tylenol and ibuprofen for pain and fever control.  Follow Up: Follow up with your primary care doctor in 5-7 days for recheck of ongoing symptoms.  Return to emergency department for emergent changing or worsening of symptoms.

## 2020-12-06 NOTE — ED Provider Notes (Signed)
[facility] DEPT Provider Note   CSN: [id number] Arrival date & time: 12/06/20  0736     History Chief Complaint  Patient presents with   Cough   Nasal Congestion    Amber Nash is a 65 y.o. female.  HPI Amber Nash is a 65 yo female with a PMH of asthma presenting to the emergency room with 5 days of productive cough, fatigue, chills, and congestion.   The patient states on 12/02/2020 she went to urgent care and was given prednisone, benzonatate, and proair. She tested negative for the flu. Since then, she states that her congestion has gotten better but her cough has gotten worse. She reports a productive cough with yellow mucus. She states she gets in "coughing fits" that makes it hard for her to catch her breath. She endorses continuing fatigue, however it has not gotten worse. She has had one episode of diarrhea yesterday. She denies fevers, nausea, vomiting, chest pain, abdominal pain, or bloody stools.   Denies any hemoptysis chest pain lightheadedness or dizziness.  Denies any leg swelling or calf pain.     Past Medical History:  Diagnosis Date   Allergy    Anxiety    Arthritis    Asthma    GERD (gastroesophageal reflux disease)    Migraine    Osteoporosis    Ovarian cyst     There are no problems to display for this patient.   Past Surgical History:  Procedure Laterality Date   BREAST EXCISIONAL BIOPSY Left    BREAST LUMPECTOMY Left    7 yrs ago - benign    COLONOSCOPY     in [place]-  ~ 10 yrs ago- she said polyps- but no report      OB History   No obstetric history on file.     Family History  Problem Relation Age of Onset   Breast cancer Sister 37   Colon cancer Neg Hx    Colon polyps Neg Hx    Esophageal cancer Neg Hx    Prostate cancer Neg Hx    Rectal cancer Neg Hx     Social History   Tobacco Use   Smoking status: Never   Smokeless tobacco: Never  Vaping Use   Vaping Use: Never used  Substance Use  Topics   Alcohol use: Not Currently    Comment: occasionally    Drug use: Not Currently    Home Medications Prior to Admission medications   Medication Sig Start Date End Date Taking? Authorizing Provider  promethazine-dextromethorphan (PROMETHAZINE-DM) 6.25-15 MG/5ML syrup Take 5 mLs by mouth 4 (four) times daily as needed for cough. 12/06/20  Yes Merline Perkin S, PA  albuterol (VENTOLIN HFA) 108 (90 Base) MCG/ACT inhaler Inhale into the lungs every 6 (six) hours as needed for wheezing or shortness of breath.    [provider]  bisacodyl (DULCOLAX) 5 MG EC tablet Take 5 mg by mouth daily as needed for moderate constipation.    [provider]  cholecalciferol (VITAMIN D3) 25 MCG (1000 UT) tablet Take 1,000 Units by mouth daily.    [provider]  fexofenadine-pseudoephedrine (ALLEGRA-D) 60-120 MG 12 hr tablet Take 1 tablet by mouth 2 (two) times daily.    [provider]  rizatriptan (MAXALT) 10 MG tablet Take 10 mg by mouth daily as needed for migraine. May repeat in 2 hours if needed    [provider]    Allergies    Patient has no  known allergies.  Review of Systems   Review of Systems  Constitutional:  Positive for chills and fatigue. Negative for fever.  HENT:  Positive for congestion.   Eyes:  Negative for pain.  Respiratory:  Positive for cough. Negative for shortness of breath.   Cardiovascular:  Negative for chest pain and leg swelling.  Gastrointestinal:  Negative for abdominal pain, diarrhea, nausea and vomiting.  Genitourinary:  Negative for dysuria.  Musculoskeletal:  Positive for myalgias.  Skin:  Negative for rash.  Neurological:  Negative for dizziness and headaches.   Physical Exam Updated Vital Signs BP 139/82 (BP Location: Left Arm)   Pulse 69   Temp (!) 97.5 F (36.4 C) (Oral)   Resp 16   Ht 5\' 4"  (1.626 m)   Wt 59.9 kg   SpO2 96%   BMI 22.66 kg/m   Physical Exam Vitals and nursing note reviewed.   Constitutional:      General: She is not in acute distress.    Comments: Pleasant well-appearing 65 year old.  In no acute distress.  Sitting comfortably in bed.  Able answer questions appropriately follow commands. No increased work of breathing. Speaking in full sentences.   HENT:     Head: Normocephalic and atraumatic.     Nose: Nose normal.     Mouth/Throat:     Mouth: Mucous membranes are moist.  Eyes:     General: No scleral icterus. Cardiovascular:     Rate and Rhythm: Normal rate and regular rhythm.     Pulses: Normal pulses.     Heart sounds: Normal heart sounds.  Pulmonary:     Effort: Pulmonary effort is normal. No respiratory distress.     Breath sounds: Normal breath sounds. No wheezing.     Comments: Speaking in full sentences Abdominal:     Palpations: Abdomen is soft.     Tenderness: There is no abdominal tenderness. There is no guarding or rebound.  Musculoskeletal:     Cervical back: Normal range of motion.     Right lower leg: No edema.     Left lower leg: No edema.  Skin:    General: Skin is warm and dry.     Capillary Refill: Capillary refill takes less than 2 seconds.  Neurological:     Mental Status: She is alert. Mental status is at baseline.  Psychiatric:        Mood and Affect: Mood normal.        Behavior: Behavior normal.    ED Results / Procedures / Treatments   Labs (all labs ordered are listed, but only abnormal results are displayed) Labs Reviewed  RESP PANEL BY RT-PCR (FLU A&B, COVID) ARPGX2    EKG None  Radiology DG Chest 2 View  Result Date: 12/06/2020 CLINICAL DATA:  Cough. EXAM: CHEST - 2 VIEW COMPARISON:  April 16, 2018 FINDINGS: The heart size and mediastinal contours are within normal limits. Both lungs are clear. The visualized skeletal structures are unremarkable. IMPRESSION: No active cardiopulmonary disease. Electronically Signed   By: Fidela Salisbury M.D.   On: 12/06/2020 10:17    Procedures Procedures    Medications Ordered in ED Medications - No data to display  ED Course  I have reviewed the triage vital signs and the nursing notes.  Pertinent labs & imaging results that were available during my care of the patient were reviewed by me and considered in my medical decision making (see chart for details).    MDM Rules/Calculators/A&P  Patient is a 65 year old female presented to the ER today with complaints of cough, congestion, fatigue  She was started on prednisone, benzonatate, and inhalers 5 days ago and states that she came to the ER because her symptoms are continued.  She is concerned about having pneumonia.  Her exam is quite reassuring her vital signs are within normal meds she is afebrile and well-appearing.  Chest x-ray obtained which is negative for any infiltrate or acute abnormality.  COVID influenza are negative.  On reassessment patient is requesting work note.  We will provide her with this.  Return precautions given.  Will recommend follow-up with primary care.  Amber Nash was evaluated in Emergency Department on 12/06/2020 for the symptoms described in the history of present illness. She was evaluated in the context of the global COVID-19 pandemic, which necessitated consideration that the patient might be at risk for infection with the SARS-CoV-2 virus that causes COVID-19. Institutional protocols and algorithms that pertain to the evaluation of patients at risk for COVID-19 are in a state of rapid change based on information released by regulatory bodies including the CDC and federal and state organizations. These policies and algorithms were followed during the patient's care in the ED.   Final Clinical Impression(s) / ED Diagnoses Final diagnoses:  Viral URI with cough    Rx / DC Orders ED Discharge Orders          Ordered    promethazine-dextromethorphan (PROMETHAZINE-DM) 6.25-15 MG/5ML syrup  4 times daily PRN        12/06/20  0917             Tedd Sias, PA 12/06/20 1056    Lacretia Leigh, MD 12/08/20 1549

## 2020-12-06 NOTE — ED Triage Notes (Signed)
Patient reports new onset cough with white mucus, sneezing, since Wednesday. Says she went to urgent care and was given albuterol, benzonatate, bromfed and prednisone. Patient says she isnt feeling better. Her flu and covid were negative. Patient says her lungs feel inflamed and her eyes were glazed over yesterday., patient then denies pain in triage. Says she has asthma and having to use her inhaler more, patient concerned her lungs are infected.

## 2021-04-09 ENCOUNTER — Other Ambulatory Visit: Payer: Self-pay | Admitting: Family Medicine

## 2021-04-09 DIAGNOSIS — E042 Nontoxic multinodular goiter: Secondary | ICD-10-CM

## 2021-04-16 ENCOUNTER — Ambulatory Visit
Admission: RE | Admit: 2021-04-16 | Discharge: 2021-04-16 | Disposition: A | Discharge status: Home / Self Care | Payer: Medicare Other | Source: Ambulatory Visit | Attending: Family Medicine | Admitting: Family Medicine

## 2021-04-16 DIAGNOSIS — E042 Nontoxic multinodular goiter: Secondary | ICD-10-CM

## 2021-04-22 ENCOUNTER — Ambulatory Visit: Payer: Self-pay | Admitting: General Surgery

## 2021-04-22 DIAGNOSIS — Z170 Estrogen receptor positive status [ER+]: Secondary | ICD-10-CM

## 2021-04-23 ENCOUNTER — Encounter: Payer: Self-pay | Admitting: *Deleted

## 2021-04-23 NOTE — Progress Notes (Unsigned)
Requested slides from [facility] to be sent over to [facility], fax sent to [phone], confirmation received. Let pathology know to be on the look out for the material. ?

## 2021-04-26 ENCOUNTER — Ambulatory Visit
Admission: RE | Admit: 2021-04-26 | Discharge: 2021-04-26 | Disposition: A | Discharge status: Home / Self Care | Payer: Self-pay | Source: Ambulatory Visit | Attending: Radiation Oncology | Admitting: Radiation Oncology

## 2021-04-26 ENCOUNTER — Other Ambulatory Visit: Payer: Self-pay | Admitting: Radiation Oncology

## 2021-04-26 ENCOUNTER — Telehealth: Payer: Self-pay | Admitting: Radiation Oncology

## 2021-04-26 ENCOUNTER — Inpatient Hospital Stay
Admission: RE | Admit: 2021-04-26 | Discharge: 2021-04-26 | Disposition: A | Discharge status: Home / Self Care | Payer: Self-pay | Source: Ambulatory Visit | Attending: Radiation Oncology | Admitting: Radiation Oncology

## 2021-04-26 DIAGNOSIS — C50112 Malignant neoplasm of central portion of left female breast: Secondary | ICD-10-CM

## 2021-04-26 NOTE — Telephone Encounter (Signed)
Called patient to schedule a consultation w. Dr. Squire. No answer, unable to LVM, VM is full.  

## 2021-04-27 ENCOUNTER — Telehealth: Payer: Self-pay | Admitting: Radiation Oncology

## 2021-04-27 NOTE — Telephone Encounter (Signed)
Called patient to schedule a consultation w. Dr. Squire. No answer, LVM for a return call.  

## 2021-04-28 ENCOUNTER — Telehealth: Payer: Self-pay | Admitting: Radiation Oncology

## 2021-04-28 NOTE — Telephone Encounter (Signed)
Called patient to schedule a consultation w. Dr. Squire. No answer, LVM for a return call.  

## 2021-04-28 NOTE — Telephone Encounter (Signed)
Sent unable to contact letter 3/29.  ?

## 2021-05-06 ENCOUNTER — Other Ambulatory Visit: Payer: Self-pay | Admitting: Family Medicine

## 2021-05-06 DIAGNOSIS — Z13820 Encounter for screening for osteoporosis: Secondary | ICD-10-CM

## 2022-01-09 ENCOUNTER — Emergency Department (HOSPITAL_COMMUNITY): Payer: Medicare Other

## 2022-01-09 ENCOUNTER — Other Ambulatory Visit: Payer: Self-pay

## 2022-01-09 ENCOUNTER — Emergency Department (HOSPITAL_COMMUNITY)
Admission: EM | Admit: 2022-01-09 | Discharge: 2022-01-09 | Disposition: A | Discharge status: Home / Self Care | Payer: Medicare Other | Attending: Emergency Medicine | Admitting: Emergency Medicine

## 2022-01-09 DIAGNOSIS — Z20822 Contact with and (suspected) exposure to covid-19: Secondary | ICD-10-CM | POA: Insufficient documentation

## 2022-01-09 DIAGNOSIS — J101 Influenza due to other identified influenza virus with other respiratory manifestations: Secondary | ICD-10-CM | POA: Diagnosis not present

## 2022-01-09 DIAGNOSIS — Z7951 Long term (current) use of inhaled steroids: Secondary | ICD-10-CM | POA: Insufficient documentation

## 2022-01-09 DIAGNOSIS — J45909 Unspecified asthma, uncomplicated: Secondary | ICD-10-CM | POA: Diagnosis not present

## 2022-01-09 DIAGNOSIS — R000 Tachycardia, unspecified: Secondary | ICD-10-CM | POA: Diagnosis not present

## 2022-01-09 DIAGNOSIS — R059 Cough, unspecified: Secondary | ICD-10-CM | POA: Diagnosis present

## 2022-01-09 DIAGNOSIS — J111 Influenza due to unidentified influenza virus with other respiratory manifestations: Secondary | ICD-10-CM

## 2022-01-09 LAB — RESP PANEL BY RT-PCR (FLU A&B, COVID) ARPGX2
Influenza A by PCR: POSITIVE — AB
Influenza B by PCR: NEGATIVE
SARS Coronavirus 2 by RT PCR: NEGATIVE

## 2022-01-09 MED ORDER — ACETAMINOPHEN 325 MG PO TABS
650.0000 mg | ORAL_TABLET | Freq: Once | ORAL | Status: AC
  Administered 2022-01-09: 650 mg via ORAL
  Filled 2022-01-09: qty 2

## 2022-01-09 NOTE — ED Provider Triage Note (Signed)
Emergency Medicine Provider Triage Evaluation Note  Amber Nash , a 66 y.o. female  was evaluated in triage.  Pt complains of URI symptoms.  Patient reports beginning Friday she developed generalized bodyaches and chills, fatigue, decreased appetite, nausea without vomiting, fevers, sore throat.  Patient states she has been vaccinated for the flu, COVID-19.  Patient unsure of sick contacts.  Patient reports that she recently returned home from traveling.  Review of Systems  Positive:  Negative:   Physical Exam  BP 128/69 (BP Location: Left Arm)   Pulse (!) 139   Temp (!) 100.5 F (38.1 C) (Oral)   Resp 18   Ht '5\' 4"'$  (1.626 m)   Wt 60 kg   SpO2 96%   BMI 22.71 kg/m  Gen:   Awake, no distress   Resp:  Normal effort  MSK:   Moves extremities without difficulty  Other:  Clear lung sounds  Medical Decision Making  Medically screening exam initiated at 12:19 PM.  Appropriate orders placed.  Amber Nash was informed that the remainder of the evaluation will be completed by another provider, this initial triage assessment does not replace that evaluation, and the importance of remaining in the ED until their evaluation is complete.     Azucena Cecil, PA-C 01/09/22 1219

## 2022-01-09 NOTE — ED Provider Notes (Signed)
[facility] DEPT Provider Note   CSN: [id number] Arrival date & time: 01/09/22  1150     History  Chief Complaint  Patient presents with   Cough    Amber Nash is a 66 y.o. female with medical history of allergy, anxiety, arthritis, asthma, GERD, migraines.  Patient presents to ED for evaluation of URI symptoms. Patient complains of URI symptoms.  Patient reports beginning Friday she developed generalized bodyaches and chills, fatigue, decreased appetite, nausea without vomiting, fevers, sore throat.  Patient states she has been vaccinated for the flu, COVID-19.  Patient unsure of sick contacts.  Patient reports that she recently returned home from traveling. Patient denies vomiting, dysuria, trouble swallowing, chest pain.    Cough Associated symptoms: chills, fever and myalgias   Associated symptoms: no chest pain        Home Medications Prior to Admission medications   Medication Sig Start Date End Date Taking? Authorizing Provider  albuterol (VENTOLIN HFA) 108 (90 Base) MCG/ACT inhaler Inhale into the lungs every 6 (six) hours as needed for wheezing or shortness of breath.    [provider]  bisacodyl (DULCOLAX) 5 MG EC tablet Take 5 mg by mouth daily as needed for moderate constipation.    [provider]  cholecalciferol (VITAMIN D3) 25 MCG (1000 UT) tablet Take 1,000 Units by mouth daily.    [provider]  fexofenadine-pseudoephedrine (ALLEGRA-D) 60-120 MG 12 hr tablet Take 1 tablet by mouth 2 (two) times daily.    [provider]  promethazine-dextromethorphan (PROMETHAZINE-DM) 6.25-15 MG/5ML syrup Take 5 mLs by mouth 4 (four) times daily as needed for cough. 12/06/20   Tedd Sias, PA  rizatriptan (MAXALT) 10 MG tablet Take 10 mg by mouth daily as needed for migraine. May repeat in 2 hours if needed    [provider]      Allergies    Patient has no known allergies.    Review of  Systems   Review of Systems  Constitutional:  Positive for chills and fever.  HENT:  Negative for trouble swallowing.   Respiratory:  Positive for cough.   Cardiovascular:  Negative for chest pain.  Gastrointestinal:  Positive for diarrhea and nausea. Negative for vomiting.  Genitourinary:  Negative for dysuria.  Musculoskeletal:  Positive for myalgias.  All other systems reviewed and are negative.   Physical Exam Updated Vital Signs BP 128/70   Pulse (!) 108   Temp 100 F (37.8 C) (Oral)   Resp 18   Ht '5\' 4"'$  (1.626 m)   Wt 60 kg   SpO2 98%   BMI 22.71 kg/m  Physical Exam Vitals and nursing note reviewed.  Constitutional:      General: She is not in acute distress.    Appearance: She is well-developed.  HENT:     Head: Normocephalic and atraumatic.     Nose: Nose normal.     Mouth/Throat:     Mouth: Mucous membranes are moist.     Pharynx: Posterior oropharyngeal erythema present. No oropharyngeal exudate.  Eyes:     Conjunctiva/sclera: Conjunctivae normal.  Cardiovascular:     Rate and Rhythm: Normal rate and regular rhythm.     Heart sounds: No murmur heard. Pulmonary:     Effort: Pulmonary effort is normal. No respiratory distress.     Breath sounds: Normal breath sounds. No wheezing.  Abdominal:     Palpations: Abdomen is soft.     Tenderness: There is no abdominal  tenderness.  Musculoskeletal:        General: No swelling.     Cervical back: Normal range of motion and neck supple. No rigidity or tenderness.  Skin:    General: Skin is warm and dry.     Capillary Refill: Capillary refill takes less than 2 seconds.  Neurological:     Mental Status: She is alert and oriented to person, place, and time.  Psychiatric:        Mood and Affect: Mood normal.     ED Results / Procedures / Treatments   Labs (all labs ordered are listed, but only abnormal results are displayed) Labs Reviewed  RESP PANEL BY RT-PCR (FLU A&B, COVID) ARPGX2 - Abnormal; Notable for  the following components:      Result Value   Influenza A by PCR POSITIVE (*)    All other components within normal limits    EKG None  Radiology DG Chest 2 View  Result Date: 01/09/2022 CLINICAL DATA:  Short of breath.  Chest congestion. EXAM: CHEST - 2 VIEW COMPARISON:  12/06/2020. FINDINGS: Cardiac silhouette is normal in size and configuration. Normal mediastinal and hilar contours. Prominent bronchial markings in the medial lower lobes accentuated by relatively low lung volumes. Lungs otherwise clear. No pleural effusion or pneumothorax. Previous left breast surgery. Skeletal structures are intact. IMPRESSION: 1. Prominent bronchial markings in the medial lower lobes. Consider acute bronchitis in the proper clinical setting. No evidence of pneumonia or pulmonary edema. No other evidence of an acute abnormality. Electronically Signed   By: Lajean Manes M.D.   On: 01/09/2022 12:39    Procedures Procedures   Medications Ordered in ED Medications  acetaminophen (TYLENOL) tablet 650 mg (650 mg Oral Given 01/09/22 1413)    ED Course/ Medical Decision Making/ A&P                           Medical Decision Making Amount and/or Complexity of Data Reviewed Radiology: ordered.  Risk OTC drugs.   65 year old female presents ED for evaluation.  Please see HPI for further details.  On examination patient is slightly tachycardic to 108, febrile.  Patient lung sounds clear bilaterally, she is not hypoxic.  Patient abdomen soft and compressible throughout.  Patient neurological examination shows no focal neurodeficits.  Patient posterior oropharynx is erythematous without exudate.  Patient viral testing positive for influenza.  Patient chest x-ray shows no consolidations or effusions.  Patient was able to pass fluid challenge test here, has not had any active vomiting while here in the department.  At this time, patient most likely suffering from symptoms related to her influenza.   Patient counseled to treat symptoms at home conservatively.  Patient given return precautions and she voiced understanding.  Patient had all her questions answered to her satisfaction.  The patient is stable for discharge.  Final Clinical Impression(s) / ED Diagnoses Final diagnoses:  Influenza    Rx / DC Orders ED Discharge Orders     None         Azucena Cecil, PA-C 01/09/22 1443    Varney Biles, MD 01/09/22 1510

## 2022-01-09 NOTE — Discharge Instructions (Signed)
Return to the ED with any new or worsening signs or symptoms Please treat symptoms at home conservatively.  Please use DayQuil, NyQuil.  Please utilize Tylenol for headaches and fevers.  Please utilize ibuprofen for bodyaches and chills.  Please purchase lidocaine was also helps shorten the duration of symptoms.  Patient any pushing fluids to include Pedialyte, Body Armor.  Please eat a high protein, low fat diet. Please read attached guide concerning influenza Please see attached work note

## 2022-01-09 NOTE — ED Triage Notes (Signed)
Pt reports cough and sore throat x few days. HR 130's in triage

## 2022-01-13 NOTE — ED Notes (Signed)
Pt asking questions regarding prescriptions sent at discharge.

## 2023-01-10 LAB — LAB REPORT - SCANNED: A1c: 6.6

## 2023-03-03 ENCOUNTER — Encounter: Payer: Medicare Other | Attending: Geriatric Medicine | Admitting: Dietician

## 2023-03-03 ENCOUNTER — Encounter: Payer: Self-pay | Admitting: Dietician

## 2023-03-03 VITALS — Ht 64.0 in | Wt 131.0 lb

## 2023-03-03 DIAGNOSIS — E119 Type 2 diabetes mellitus without complications: Secondary | ICD-10-CM | POA: Diagnosis present

## 2023-03-03 NOTE — Patient Instructions (Addendum)
Stelo Continuous Glucose Monitor by Dexcom - this can be an option to help bring awareness of what is affecting your blood glucose.  If you decide to use this, I can help you understand this better.  It is not necessary that you do blood glucose testing at this time but if you wish to learn more about your blood glucose trends, this can be helpful.  Each sensor lasts for 15 days.  Fruits & Vegetables: Aim to fill half your plate with a variety of fruits and vegetables. They are rich in vitamins, minerals, and fiber, and can help reduce the risk of chronic diseases. Choose a colorful assortment of fruits and vegetables to ensure you get a wide range of nutrients. Grains and Starches: Make at least half of your grain choices whole grains, such as brown rice, whole wheat bread, and oats. Whole grains provide fiber, which aids in digestion and healthy cholesterol levels. Aim for whole forms of starchy vegetables such as potatoes, sweet potatoes, beans, peas, and corn, which are fiber rich and provide many vitamins and minerals.  Protein: Incorporate lean sources of protein, such as poultry, fish, beans, nuts, and seeds, into your meals. Protein is essential for building and repairing tissues, staying full, balancing blood sugar, as well as supporting immune function. Dairy: Include low-fat or fat-free dairy products like milk, yogurt, and cheese in your diet.  Choose nuts, seeds, avocado, small portions of oil (avocado or olive) more frequently than butter and other saturated fats.  Remove the skin from the chicken and trim the fat from meat.   Consider more plant based meal options. Physical Activity: Aim for 30 minutes of physical activity daily. Regular physical activity promotes overall health and can help lower blood glucose  Mindfulness:  Consistently scheduled meal - avoid skipping  Choices  Eat slowly  Away from distraction (sitting in kitchen or dining room)  Stop eating when satisfied  Before a  snack ask, "Am I hungry or eating for another reason?"   "What can I do instead if I am not hungry?"  Try to find something every day that brings you joy!

## 2023-03-03 NOTE — Progress Notes (Signed)
Diabetes Self-Management Education  Visit Type: First/Initial  Appt. Start Time: 0905 Appt. End Time: 1020  03/03/2023  [name], identified by name and date of birth, is a 68 y.o. female with a diagnosis of Diabetes: Type 2.   ASSESSMENT Patient is here today alone.  She is newly diagnosed with diabetes.  She is concerned that the anti estrogen medication she takes for breast cancer has caused this and has stopped her medication for 1 month.  Cholesterol is also quite elevated and noted this also may increase cholesterol. She states that she will continue to exercise and lower stress.  She states that she eats healthy and is now stopping the soda and is trying to eat <25 grams of sugar per day (6 tsp). Steroid shot in her arm 2 days ago.    She states that she loves chicken skin and the fat on a steak.  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  She does her own cooking and shopping.  She is a retired Investment banker, operational.  She is from [facility] and moved during the pandemic. She is now an Tree surgeon.  She recently went to [place]. 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 She cannot tolerate fresh fruit as she vomits them except berries and small portion of banana. Sleep - poor wakes frequently between 2 am and 4 am. Goes to the gym 1-2 times per week for 30 minutes.  Exercise class for 1 hour at the senior center.  Walks her dog for 30 minutes daily.  Her father had diabetes and had a stroke.  He did not take  care of himself.  Height 5\' 4"  (1.626 m), weight 131 lb (59.4 kg). Body mass index is 22.49 kg/m.   Diabetes Self-Management Education - 03/03/23 0934       Visit Information   Visit Type First/Initial      Initial Visit   Diabetes Type Type 2    Date Diagnosed 02/2023    Are you currently following a meal plan? Yes    What type of meal plan do you follow? reduced sugar    Are you taking your medications as prescribed? Not on Medications      Health Coping   How would you rate your overall health? Fair      Psychosocial Assessment   Patient Belief/Attitude about Diabetes Motivated to manage diabetes    Self-care barriers None    Self-management support Doctor's office    Other persons present Patient    Patient Concerns Nutrition/Meal planning;Healthy Lifestyle    Special Needs None    Preferred Learning Style No preference indicated    Learning Readiness Ready    How often do you need to have someone help you when you read instructions, pamphlets, or other written materials from your doctor or pharmacy? 1 - Never    What is the last grade level you completed in school? JD      Pre-Education Assessment   Patient understands the diabetes disease and treatment process. Needs  Instruction    Patient understands incorporating nutritional management into lifestyle. Needs Instruction    Patient undertands incorporating physical activity into lifestyle. Needs Instruction    Patient understands using medications safely. Needs Instruction    Patient understands monitoring blood glucose, interpreting and using results Needs Instruction    Patient understands prevention, detection, and treatment of acute complications. Needs Instruction    Patient understands prevention, detection, and treatment of chronic complications. Needs Instruction    Patient understands how to develop strategies to address psychosocial issues. Needs Instruction    Patient understands how to develop strategies to  promote health/change behavior. Needs Instruction      Complications   Last HgB A1C per patient/outside source 6.6 %   02/2023   How often do you check your blood sugar? 0 times/day (not testing)    Have you had a dilated eye exam in the past 12 months? Yes    Have you had a dental exam in the past 12 months? Yes    Are you checking your feet? No      Dietary Intake   Breakfast oatmeal with sugar and butter, tea with 3 tsp sugar, occasional strip of bacon OR Great grains with cranberries OR skips 4 days per week    Snack (morning) none    Lunch jerk chicken, cabbage, jack fruit ([employer]) (frequently lunch/dinner combined)    Snack (afternoon) none    Dinner kale salad OR Baked potato, vegan butter, green beans (frequently lunch/dinner combined)    Snack (evening) rare bowl of cereal and milk    Beverage(s) water, herbal tea or other tea with 3 tsp sugar, coffee with lactaid milk, increased OJ currently with a cold, crand berry juice, stopped regular soda      Activity / Exercise   Activity / Exercise Type Light (walking / raking leaves)    How many days per week do you exercise? 7    How many minutes per day do you exercise? 35    Total minutes per week of exercise 245      Patient Education   Previous Diabetes Education No    Disease Pathophysiology Definition of diabetes, type 1 and 2, and the diagnosis of diabetes    Healthy Eating Meal options for control of blood glucose level and chronic complications.;Role of diet in the treatment of diabetes and the relationship between the three main macronutrients and blood glucose level;Plate Method;Effects of alcohol on blood glucose and safety factors with consumption of alcohol.    Being Active Role of exercise on diabetes management, blood pressure control and cardiac health.    Monitoring Identified appropriate SMBG and/or A1C goals.;Daily foot exams;Yearly dilated eye exam;Purpose and frequency of SMBG.    Acute complications  Taught prevention, symptoms, and  treatment of hypoglycemia - the 15 rule.    Chronic complications Relationship between chronic complications and blood glucose control    Diabetes Stress and Support Identified and addressed patients feelings and concerns about diabetes;Worked with patient to identify barriers to care and solutions;Role of stress on diabetes    Lifestyle and Health Coping Lifestyle issues that need to be addressed for better diabetes care      Post-Education Assessment   Patient understands the diabetes disease and treatment process. Demonstrates understanding / competency    Patient understands incorporating nutritional management into lifestyle. Comprehends key points    Patient undertands incorporating physical activity into lifestyle. Demonstrates understanding / competency    Patient understands using medications safely.  Demonstrates understanding / competency    Patient understands monitoring blood glucose, interpreting and using results Demonstrates understanding / competency    Patient understands prevention, detection, and treatment of acute complications. Demonstrates understanding / competency    Patient understands prevention, detection, and treatment of chronic complications. Demonstrates understanding / competency    Patient understands how to develop strategies to address psychosocial issues. Demonstrates understanding / competency    Patient understands how to develop strategies to promote health/change behavior. Needs Review      Outcomes   Expected Outcomes Demonstrated interest in learning. Expect positive outcomes    Future DMSE 2 months    Program Status Not Completed             Individualized Plan for Diabetes Self-Management Training:   Learning Objective:  Patient will have a greater understanding of diabetes self-management. Patient education plan is to attend individual and/or group sessions per assessed needs and concerns.   Plan:    Patient Instructions  Stelo Continuous Glucose Monitor by Dexcom - this can be an option to help bring awareness of what is affecting your blood glucose.  If you decide to use this, I can help you understand this better.  It is not necessary that you do blood glucose testing at this time but if you wish to learn more about your blood glucose trends, this can be helpful.  Each sensor lasts for 15 days.  Fruits & Vegetables: Aim to fill half your plate with a variety of fruits and vegetables. They are rich in vitamins, minerals, and fiber, and can help reduce the risk of chronic diseases. Choose a colorful assortment of fruits and vegetables to ensure you get a wide range of nutrients. Grains and Starches: Make at least half of your grain choices whole grains, such as brown rice, whole wheat bread, and oats. Whole grains provide fiber, which aids in digestion and healthy cholesterol levels. Aim for whole forms of starchy vegetables such as potatoes, sweet potatoes, beans, peas, and corn, which are fiber rich and provide many vitamins and minerals.  Protein: Incorporate lean sources of protein, such as poultry, fish, beans, nuts, and seeds, into your meals. Protein is essential for building and repairing tissues, staying full, balancing blood sugar, as well as supporting immune function. Dairy: Include low-fat or fat-free dairy products like milk, yogurt, and cheese in your diet.  Choose nuts, seeds, avocado, small portions of oil (avocado or olive) more frequently than butter and other saturated fats.  Remove the skin from the chicken and trim the fat from meat.   Consider more plant based meal options. Physical Activity: Aim for 30 minutes of physical activity daily. Regular physical activity promotes overall health and can help lower blood glucose  Mindfulness:  Consistently scheduled meal - avoid skipping  Choices  Eat slowly  Away from distraction (sitting in kitchen or dining room)  Stop  eating when satisfied  Before a snack ask, "Am I hungry or eating for another reason?"   "What can I do instead if I am not hungry?"  Try to find something every day that brings you joy!   Expected Outcomes:  Demonstrated interest in learning. Expect positive outcomes  Education material provided: ADA - How to Thrive: A Guide for Your Journey with Diabetes, Meal plan card, and Diabetes Resources  If problems or questions, patient to contact team via:  Phone  Future DSME appointment: 2 months

## 2023-06-16 ENCOUNTER — Ambulatory Visit: Payer: BC Managed Care – PPO | Admitting: Dietician

## 2023-08-28 IMAGING — CR DG CHEST 2V
2 series · 2 of 2 positions shown · non-contrast
Comparison: April 16, 2018

CLINICAL DATA: Cough.

EXAM:
CHEST - 2 VIEW

[w chest pa]
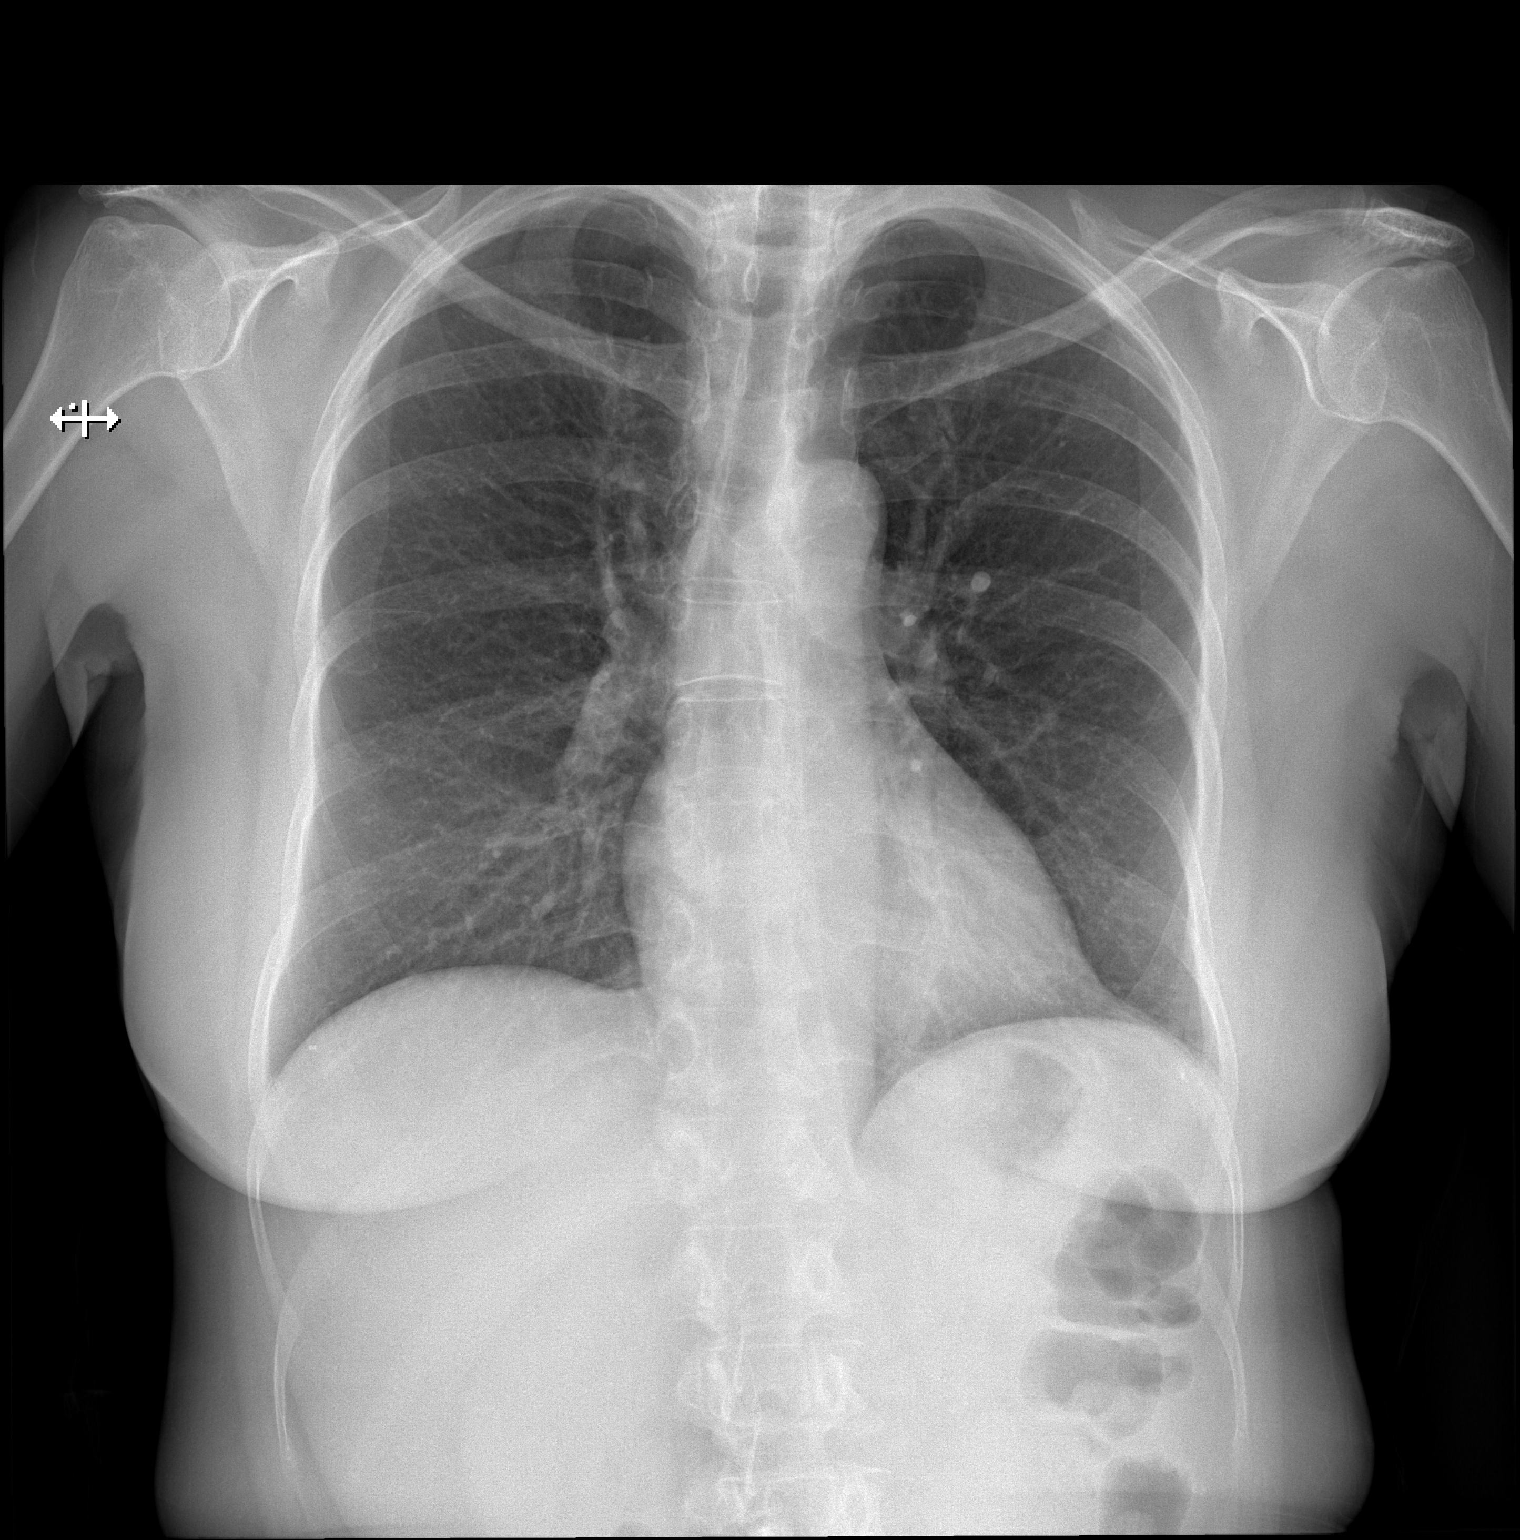

[w chest lat]
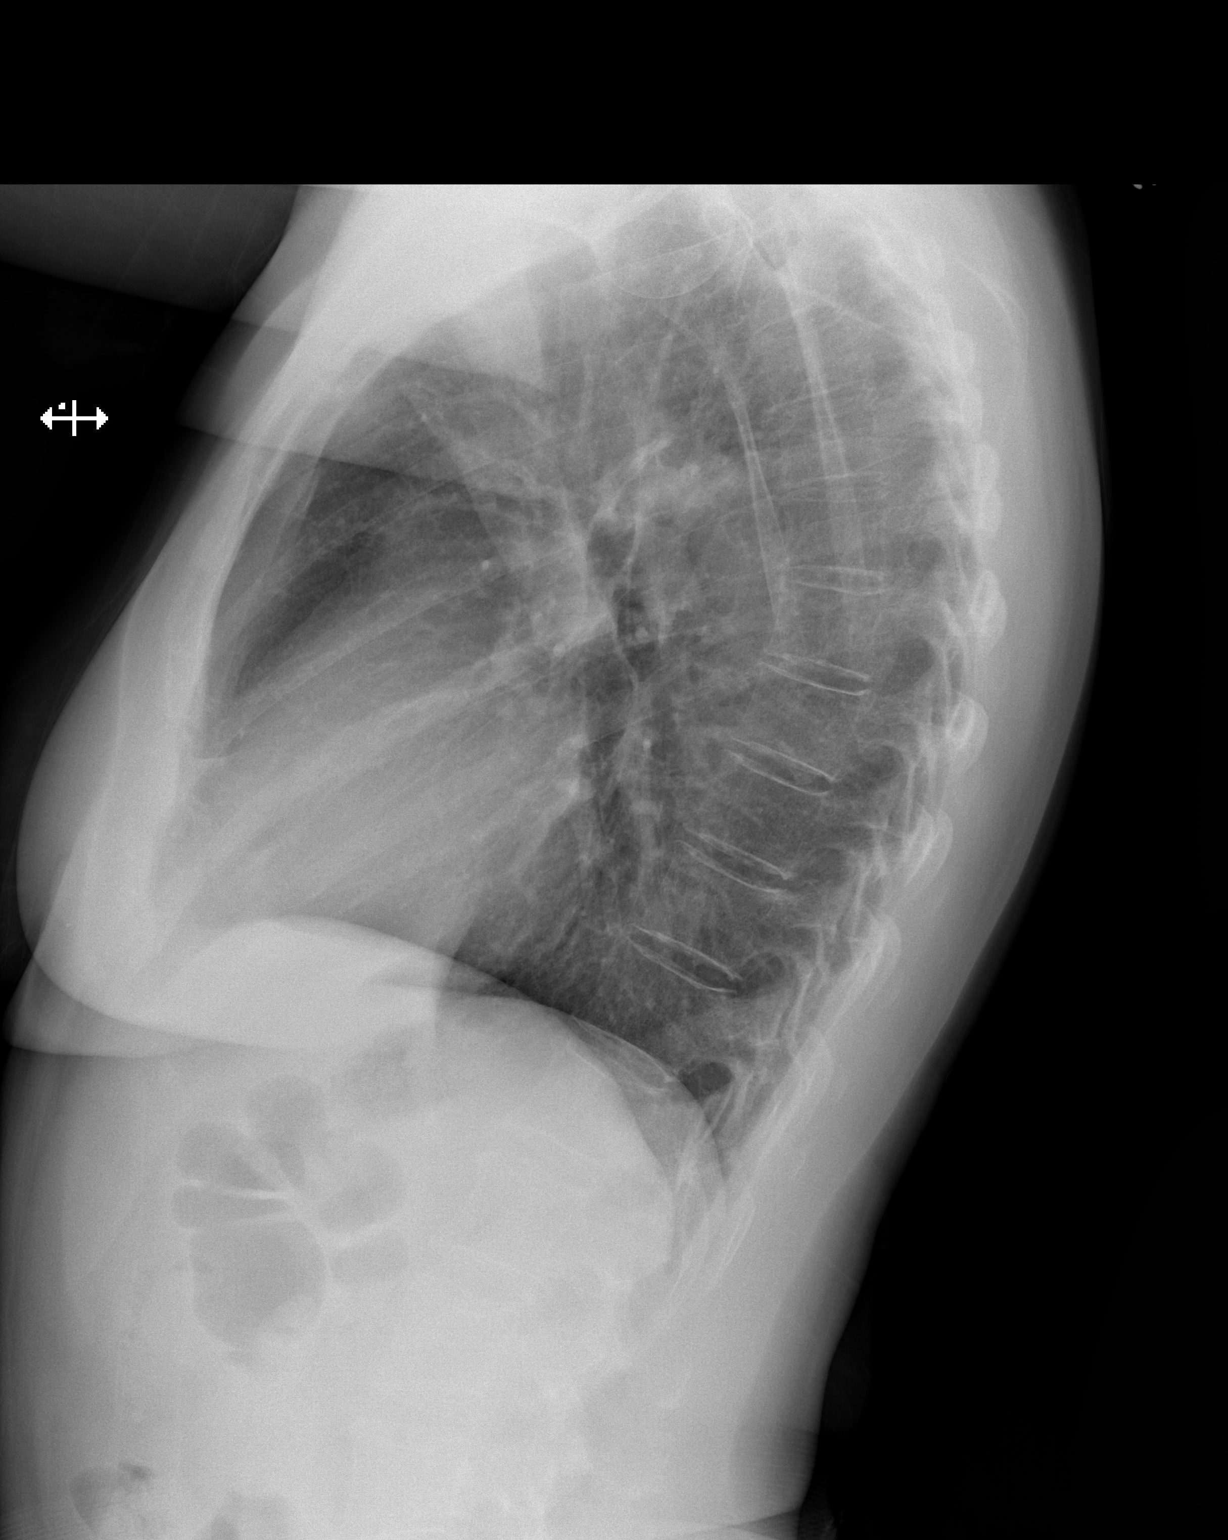

[2 of 2 positions shown; findings below may reference images not displayed]

FINDINGS: The heart size and mediastinal contours are within normal limits.
Both lungs are clear. The visualized skeletal structures are
unremarkable.
IMPRESSION: No active cardiopulmonary disease.

## 2023-09-21 ENCOUNTER — Ambulatory Visit: Admitting: Cardiology

## 2024-01-06 IMAGING — US US THYROID
1 series · 13 of 25 positions shown · non-contrast
Comparison: None.

CLINICAL DATA: Multiple thyroid nodules

EXAM:
THYROID ULTRASOUND
TECHNIQUE: Ultrasound examination of the thyroid gland and adjacent soft
tissues was performed.

[Series 1: us thyroid · 0.07mm/px · 13 of 69 slices shown]
[im 1/69]
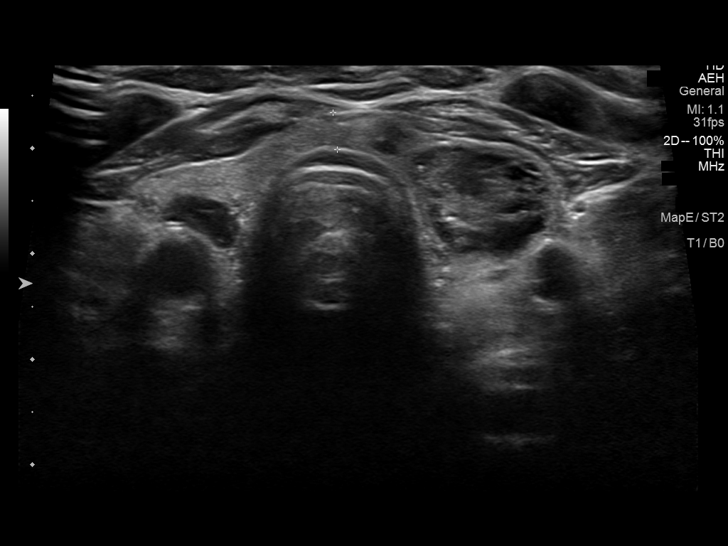
[im 6/69]
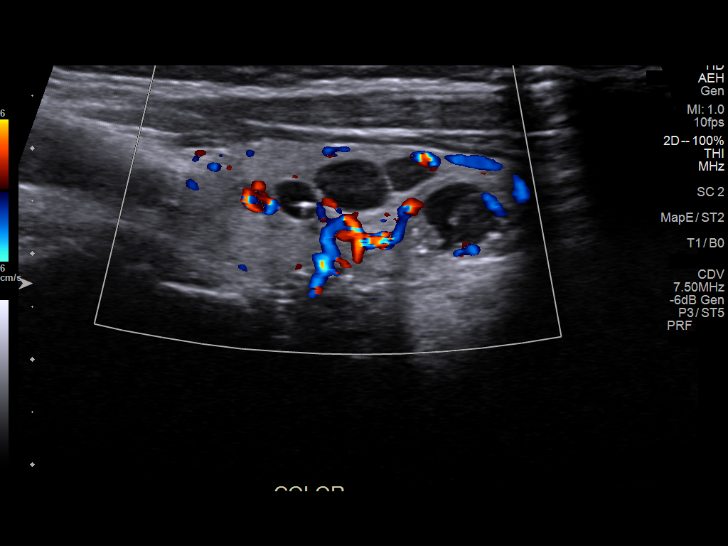
[im 12/69]
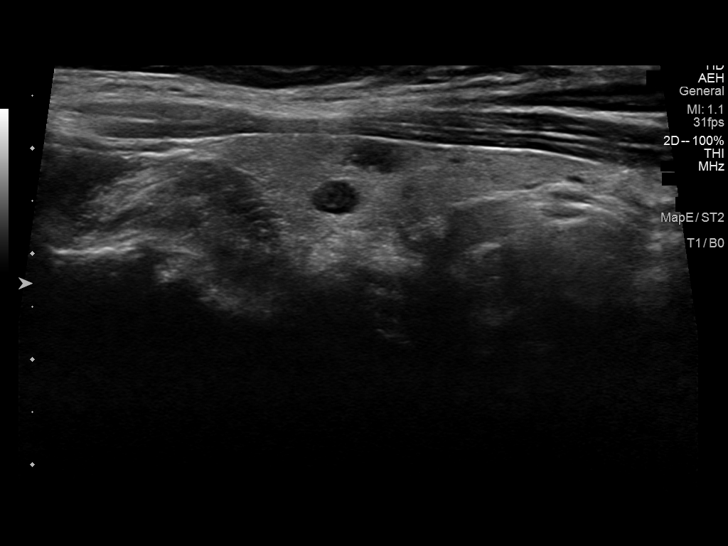
[im 18/69]
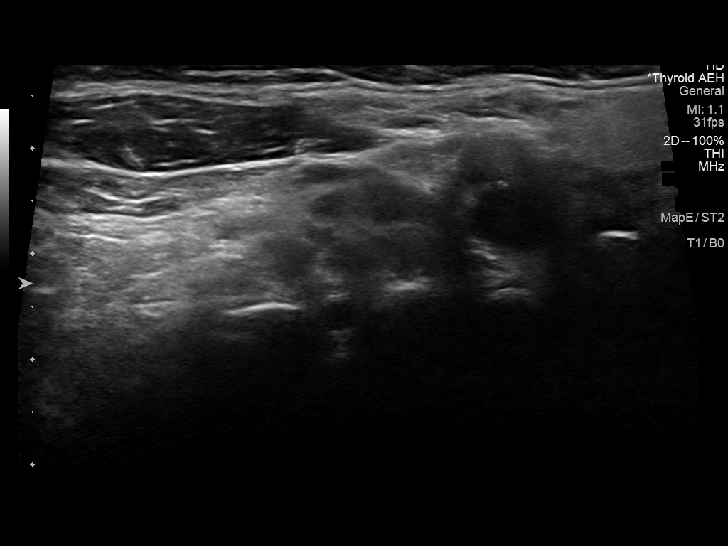
[im 23/69]
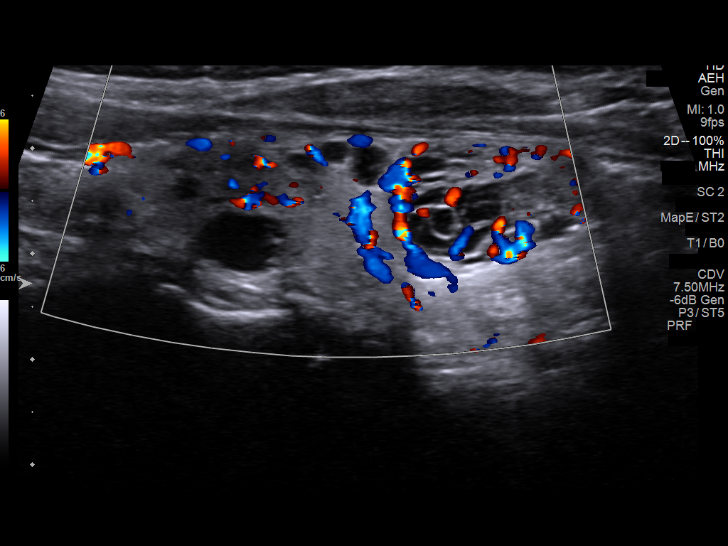
[im 29/69]
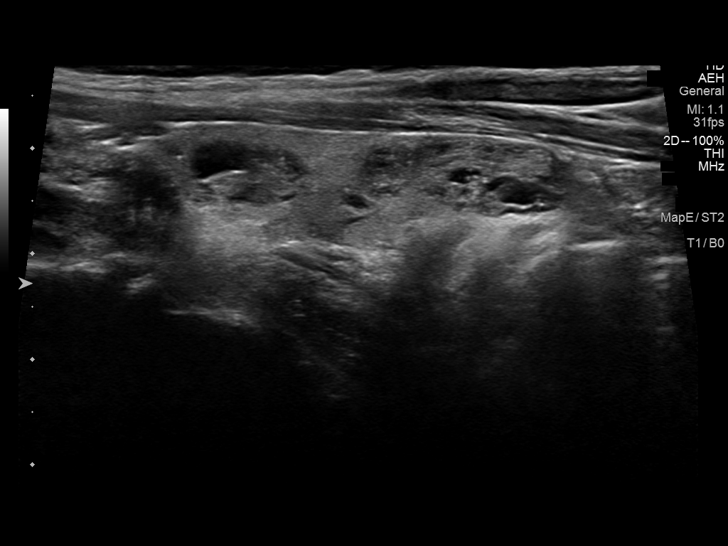
[im 35/69]
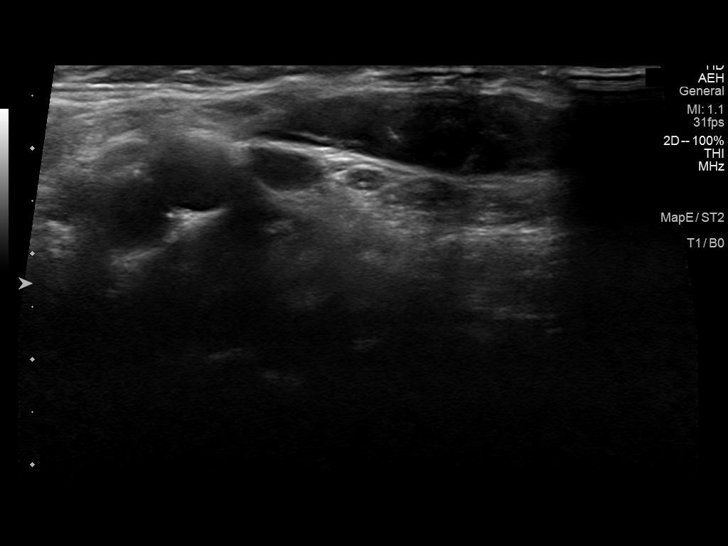
[im 40/69]
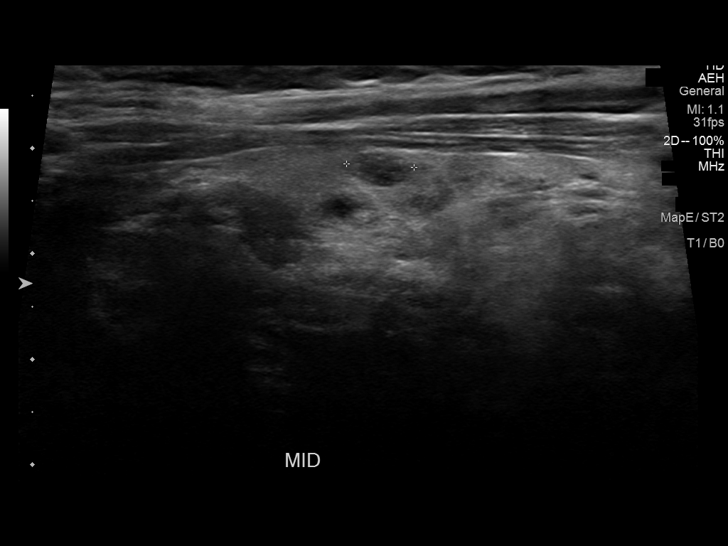
[im 46/69]
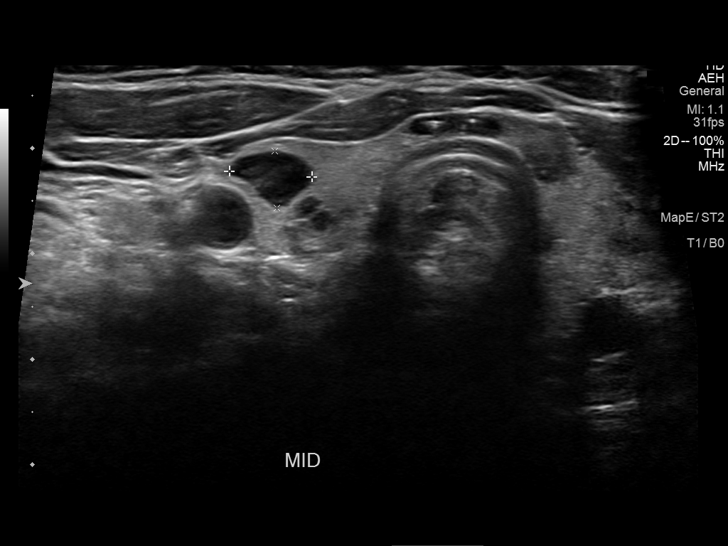
[im 52/69]
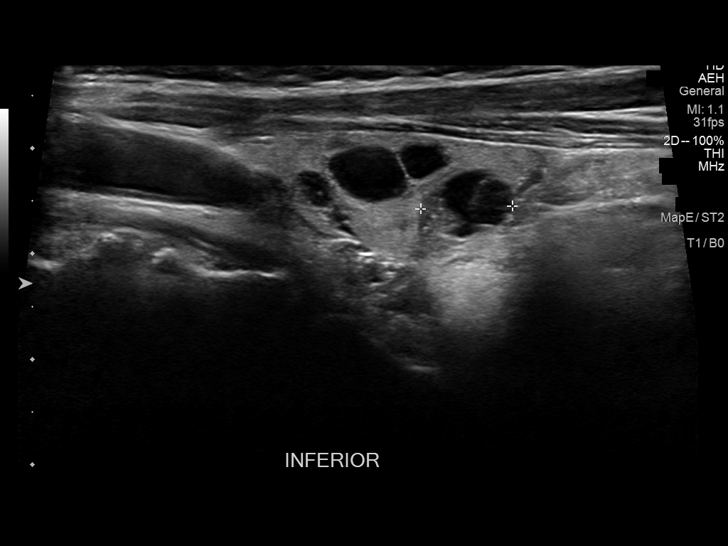
[im 57/69]
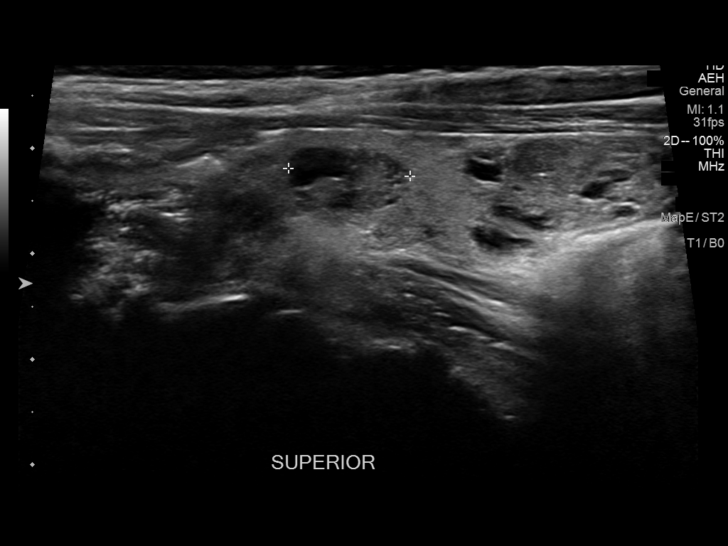
[im 63/69]
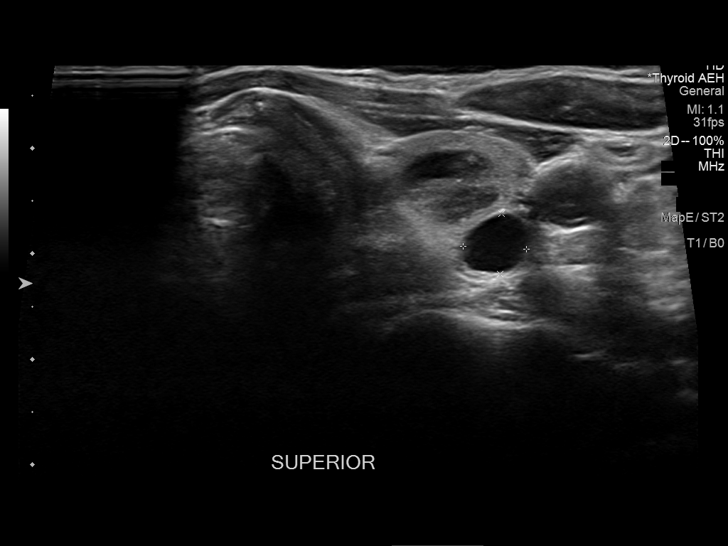
[im 69/69]
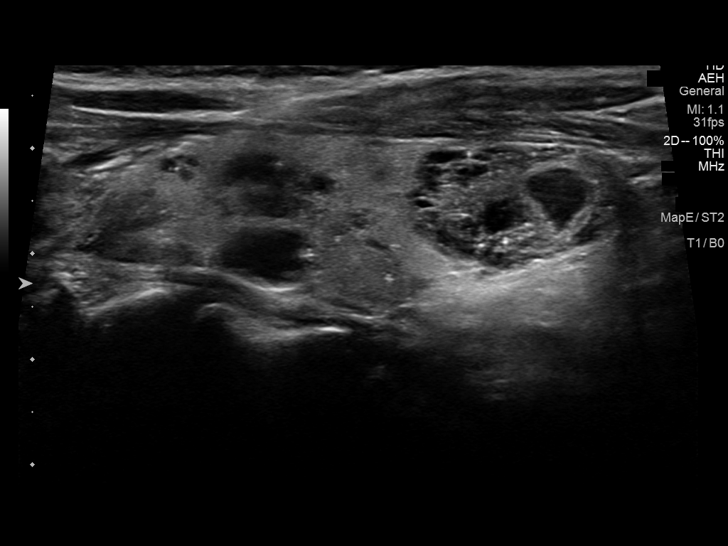

[13 of 25 positions shown; findings below may reference images not displayed]

FINDINGS: Parenchymal Echotexture: Mildly heterogenous

Isthmus: 0.4 cm

Right lobe: 3.9 x 1.3 x 1.4 cm

Left lobe: 4.7 x 1.6 x 1.6 cm

_________________________________________________________

Estimated total number of nodules >/= 1 cm: 2

Number of spongiform nodules >/=  2 cm not described below (TR1): 0

Number of mixed cystic and solid nodules >/= 1.5 cm not described
below (TR2): 0

_________________________________________________________

Nodule 8: 1.9 x 1.9 x 1.1 cm spongiform nodule in the left inferior
thyroid lobe does not meet criteria for imaging surveillance or FNA.

Nodule 6: 1.2 x 1.0 x 0.7 cm spongiform nodule in the superior left
thyroid lobe does not meet criteria for imaging surveillance or FNA.

Remaining thyroid nodules are either cystic or spongiform and do not
meet criteria for FNA or imaging follow-up.
IMPRESSION: Numerous cystic and spongiform bilateral thyroid nodules measuring
up to 1.9 cm which do not meet criteria for FNA or imaging
follow-up.

The above is in keeping with the ACR TI-RADS recommendations - [HOSPITAL] 2513;[DATE].

## 2024-02-12 ENCOUNTER — Ambulatory Visit
Admission: EM | Admit: 2024-02-12 | Discharge: 2024-02-12 | Disposition: A | Discharge status: Home / Self Care | Attending: Family Medicine | Admitting: Family Medicine

## 2024-02-12 DIAGNOSIS — H6123 Impacted cerumen, bilateral: Secondary | ICD-10-CM | POA: Diagnosis not present

## 2024-02-12 HISTORY — DX: Unspecified rotator cuff tear or rupture of unspecified shoulder, not specified as traumatic: M75.100

## 2024-02-12 NOTE — ED Provider Notes (Signed)
 " [facility]  Note:  This document was prepared using Conservation officer, historic buildings and may include unintentional dictation errors.  MRN: [id number] DOB: 1955/07/22  Subjective:   Amber Nash is a 69 y.o. female presenting for 4 day history of bilateral ear fullness, feels like her ears might be draining internally, mild intermittent ear pain. No fever, throat pain, dizziness, coughing, sinus congestion. Has had to have her ears lavaged before.   Current Outpatient Medications  Medication Instructions   albuterol  (VENTOLIN  HFA) 108 (90 Base) MCG/ACT inhaler Every 6 hours PRN   anastrozole (ARIMIDEX) 1 mg   bisacodyl (DULCOLAX) 5 mg, Daily PRN   cholecalciferol (VITAMIN D3) 1,000 Units, Daily   fexofenadine-pseudoephedrine (ALLEGRA-D) 60-120 MG 12 hr tablet 1 tablet, 2 times daily   promethazine -dextromethorphan (PROMETHAZINE -DM) 6.25-15 MG/5ML syrup 5 mLs, Oral, 4 times daily PRN   rizatriptan (MAXALT) 10 mg, Daily PRN    Allergies[1]  Past Medical History:  Diagnosis Date   Allergy    Anxiety    Arthritis    Asthma    Diabetes mellitus without complication (HCC)    GERD (gastroesophageal reflux disease)    Migraine    Osteoporosis    Ovarian cyst    Rotator cuff tear      Past Surgical History:  Procedure Laterality Date   BREAST EXCISIONAL BIOPSY Left    BREAST LUMPECTOMY Left    7 yrs ago - benign    COLONOSCOPY     in [place]-  ~ 10 yrs ago- she said polyps- but no report     Family History  Problem Relation Age of Onset   Breast cancer Sister 23   Colon cancer Neg Hx    Colon polyps Neg Hx    Esophageal cancer Neg Hx    Prostate cancer Neg Hx    Rectal cancer Neg Hx     Social History   Occupational History   Not on file  Tobacco Use   Smoking status: Never   Smokeless tobacco: Never  Vaping Use   Vaping status: Never Used  Substance and Sexual Activity   Alcohol use: Not Currently    Comment: occasionally    Drug  use: Not Currently   Sexual activity: Not on file     ROS   Objective:   Vitals: BP 131/83 (BP Location: Left Arm)   Pulse 85   Temp 98 F (36.7 C) (Oral)   Resp 18   Ht 5' 4 (1.626 m)   Wt 132 lb (59.9 kg)   SpO2 97%   BMI 22.66 kg/m   Physical Exam Constitutional:      General: She is not in acute distress.    Appearance: Normal appearance. She is well-developed. She is not ill-appearing, toxic-appearing or diaphoretic.  HENT:     Head: Normocephalic and atraumatic.     Right Ear: Tympanic membrane, ear canal and external ear normal. No tenderness. There is impacted cerumen. Tympanic membrane is not injected, perforated, erythematous or bulging.     Left Ear: Tympanic membrane, ear canal and external ear normal. No tenderness. There is no impacted cerumen (ceruminous ear canal but not occluded). Tympanic membrane is not injected, perforated, erythematous or bulging.     Nose: Nose normal.     Mouth/Throat:     Mouth: Mucous membranes are moist.  Eyes:     General: No scleral icterus.       Right eye: No discharge.  Left eye: No discharge.     Extraocular Movements: Extraocular movements intact.  Cardiovascular:     Rate and Rhythm: Normal rate.  Pulmonary:     Effort: Pulmonary effort is normal.  Skin:    General: Skin is warm and dry.  Neurological:     General: No focal deficit present.     Mental Status: She is alert and oriented to person, place, and time.  Psychiatric:        Mood and Affect: Mood normal.        Behavior: Behavior normal.    Ear lavage performed using mixture of peroxide and water.  Pressure irrigation performed using a bottle and a thin ear tube.  Bilateral ear lavage.  No curette was used. TMs clear thereafter.   Assessment and Plan :   PDMP not reviewed this encounter.  1. Impacted cerumen of right ear      Successful bilateral ear lavage.  General management of cerumen impaction reviewed with patient.  Anticipatory  guidance provided. Counseled patient on potential for adverse effects with medications prescribed/recommended today, ER and return-to-clinic precautions discussed, patient verbalized understanding.     [1] No Known Allergies    Christopher Savannah, PA-C 02/12/24 1306  "

## 2024-02-12 NOTE — ED Triage Notes (Signed)
 Pt states that she has bilateral ear fullness and ear pain. X4 days  Pt states that she has been using otc ear drops.
